# Patient Record
Sex: Female | Born: 1965
Health system: Southern US, Community
[De-identification: ages and names within clinical notes are randomized; demographics above are authoritative.]

## PROBLEM LIST (undated history)

## (undated) DIAGNOSIS — E079 Disorder of thyroid, unspecified: Secondary | ICD-10-CM

## (undated) DIAGNOSIS — J309 Allergic rhinitis, unspecified: Secondary | ICD-10-CM

## (undated) DIAGNOSIS — C50919 Malignant neoplasm of unspecified site of unspecified female breast: Secondary | ICD-10-CM

## (undated) DIAGNOSIS — D179 Benign lipomatous neoplasm, unspecified: Secondary | ICD-10-CM

## (undated) DIAGNOSIS — K589 Irritable bowel syndrome without diarrhea: Secondary | ICD-10-CM

## (undated) HISTORY — DX: Allergic rhinitis, unspecified: J30.9

## (undated) HISTORY — DX: Irritable bowel syndrome, unspecified: K58.9

## (undated) HISTORY — DX: Disorder of thyroid, unspecified: E07.9

## (undated) HISTORY — DX: Malignant neoplasm of unspecified site of unspecified female breast: C50.919

## (undated) HISTORY — DX: Benign lipomatous neoplasm, unspecified: D17.9

## (undated) HISTORY — PX: BREAST RECONSTRUCTION: SHX9

---

## 2003-04-16 HISTORY — PX: BREAST BIOPSY: SHX20

## 2003-05-17 DIAGNOSIS — C50919 Malignant neoplasm of unspecified site of unspecified female breast: Secondary | ICD-10-CM

## 2003-05-17 HISTORY — PX: MASTECTOMY: SHX3

## 2003-05-17 HISTORY — PX: BREAST LUMPECTOMY: SHX2

## 2003-05-17 HISTORY — DX: Malignant neoplasm of unspecified site of unspecified female breast: C50.919

## 2010-04-15 LAB — CONVERTED CEMR LAB

## 2010-05-11 ENCOUNTER — Ambulatory Visit (HOSPITAL_BASED_OUTPATIENT_CLINIC_OR_DEPARTMENT_OTHER): Payer: 59 | Admitting: Oncology

## 2010-05-16 DIAGNOSIS — E079 Disorder of thyroid, unspecified: Secondary | ICD-10-CM

## 2010-05-16 HISTORY — DX: Disorder of thyroid, unspecified: E07.9

## 2010-06-03 ENCOUNTER — Encounter: Payer: Self-pay | Admitting: Internal Medicine

## 2010-06-03 ENCOUNTER — Other Ambulatory Visit: Payer: Self-pay | Admitting: Internal Medicine

## 2010-06-03 ENCOUNTER — Ambulatory Visit
Admission: RE | Admit: 2010-06-03 | Discharge: 2010-06-03 | Payer: Self-pay | Source: Home / Self Care | Attending: Internal Medicine | Admitting: Internal Medicine

## 2010-06-03 DIAGNOSIS — R197 Diarrhea, unspecified: Secondary | ICD-10-CM | POA: Insufficient documentation

## 2010-06-03 DIAGNOSIS — M79609 Pain in unspecified limb: Secondary | ICD-10-CM | POA: Insufficient documentation

## 2010-06-03 DIAGNOSIS — C50919 Malignant neoplasm of unspecified site of unspecified female breast: Secondary | ICD-10-CM | POA: Insufficient documentation

## 2010-06-03 DIAGNOSIS — D179 Benign lipomatous neoplasm, unspecified: Secondary | ICD-10-CM | POA: Insufficient documentation

## 2010-06-03 DIAGNOSIS — J309 Allergic rhinitis, unspecified: Secondary | ICD-10-CM | POA: Insufficient documentation

## 2010-06-03 DIAGNOSIS — E079 Disorder of thyroid, unspecified: Secondary | ICD-10-CM | POA: Insufficient documentation

## 2010-06-03 LAB — BASIC METABOLIC PANEL
BUN: 15 mg/dL (ref 6–23)
CO2: 25 mEq/L (ref 19–32)
Calcium: 9.4 mg/dL (ref 8.4–10.5)
Chloride: 101 mEq/L (ref 96–112)
Creatinine, Ser: 0.9 mg/dL (ref 0.4–1.2)
GFR: 71.32 mL/min (ref >60.00)
Glucose, Bld: 92 mg/dL (ref 70–99)
Potassium: 3.8 mEq/L (ref 3.5–5.1)
Sodium: 133 mEq/L — ABNORMAL LOW (ref 135–145)

## 2010-06-03 LAB — URINALYSIS, ROUTINE W REFLEX MICROSCOPIC
Bilirubin Urine: NEGATIVE
Hemoglobin, Urine: NEGATIVE
Ketones, ur: NEGATIVE
Leukocytes, UA: NEGATIVE
Nitrite: NEGATIVE
Specific Gravity, Urine: 1.02 (ref 1.000–1.030)
Total Protein, Urine: NEGATIVE
Urine Glucose: NEGATIVE
Urobilinogen, UA: 0.2 (ref 0.0–1.0)
pH: 5.5 (ref 5.0–8.0)

## 2010-06-03 LAB — CBC WITH DIFFERENTIAL/PLATELET
Basophils Absolute: 0 10*3/uL (ref 0.0–0.1)
Basophils Relative: 0.7 % (ref 0.0–3.0)
Eosinophils Absolute: 0.3 10*3/uL (ref 0.0–0.7)
Eosinophils Relative: 4.3 % (ref 0.0–5.0)
HCT: 39.7 % (ref 36.0–46.0)
Hemoglobin: 13.6 g/dL (ref 12.0–15.0)
Lymphocytes Relative: 26 % (ref 12.0–46.0)
Lymphs Abs: 1.5 10*3/uL (ref 0.7–4.0)
MCHC: 34.2 g/dL (ref 30.0–36.0)
MCV: 89.6 fl (ref 78.0–100.0)
Monocytes Absolute: 0.5 10*3/uL (ref 0.1–1.0)
Monocytes Relative: 8.1 % (ref 3.0–12.0)
Neutro Abs: 3.6 10*3/uL (ref 1.4–7.7)
Neutrophils Relative %: 60.9 % (ref 43.0–77.0)
Platelets: 263 10*3/uL (ref 150.0–400.0)
RBC: 4.44 Mil/uL (ref 3.87–5.11)
RDW: 13.7 % (ref 11.5–14.6)
WBC: 5.9 10*3/uL (ref 4.5–10.5)

## 2010-06-03 LAB — HEPATIC FUNCTION PANEL
ALT: 24 U/L (ref 0–35)
AST: 29 U/L (ref 0–37)
Albumin: 4.6 g/dL (ref 3.5–5.2)
Alkaline Phosphatase: 42 U/L (ref 39–117)
Bilirubin, Direct: 0.1 mg/dL (ref 0.0–0.3)
Total Bilirubin: 0.9 mg/dL (ref 0.3–1.2)
Total Protein: 8 g/dL (ref 6.0–8.3)

## 2010-06-03 LAB — LDL CHOLESTEROL, DIRECT: Direct LDL: 112.7 mg/dL

## 2010-06-03 LAB — TSH: TSH: 0.07 u[IU]/mL — ABNORMAL LOW (ref 0.35–5.50)

## 2010-06-03 LAB — LIPID PANEL
Cholesterol: 212 mg/dL — ABNORMAL HIGH (ref 0–200)
HDL: 78 mg/dL (ref >39.00)
Total CHOL/HDL Ratio: 3
Triglycerides: 66 mg/dL (ref 0.0–149.0)
VLDL: 13.2 mg/dL (ref 0.0–40.0)

## 2010-06-04 LAB — T3, FREE: T3, Free: 2.3 pg/mL (ref 2.3–4.2)

## 2010-06-04 LAB — T4, FREE: Free T4: 0.9 ng/dL (ref 0.60–1.60)

## 2010-06-07 LAB — CONVERTED CEMR LAB: IgE (Immunoglobulin E), Serum: 320.2 intl units/mL — ABNORMAL HIGH (ref 0.0–180.0)

## 2010-06-17 NOTE — Assessment & Plan Note (Signed)
Summary: NEW/ UMR / NWS  #   Vital Signs:  Patient profile:   45 year old female Height:      71 inches (180.34 cm) Weight:      140.8 pounds (64 kg) BMI:     19.71 O2 Sat:      99 % on Room air Temp:     98.5 degrees F (36.94 degrees C) oral Pulse rate:   79 / minute BP sitting:   118 / 72  (left arm) Cuff size:   regular  Vitals Entered By: Orlan Leavens RMA (June 03, 2010 9:43 AM)  O2 Flow:  Room air CC: New patient Is Patient Diabetic? No Pain Assessment Patient in pain? no        Primary Care Provider:  Newt Lukes MD  CC:  New patient.  History of Present Illness: new pt to me and our practice, here to est care patient is here today for annual physical. Patient feels well.  Also has few concerns - 1) c/o lipoma over right forearm - present for months w/o change in size no pain, no interference with wirst/elbow function would like removed if possible for cosemetic reasons  2) c/o left index finger pain onset 2 mo ago precipitated prolonged by tight grip on cat during bathing session located in prox PIP of index finger pain with full flexion and extension also a/w swelling of this joint compared to all other joints  3) chronically unformed stools but not excess freq no fever, no change in diet, no weight loss, brbpr or abd pain - ?food allergy  4) hx breast ca - dx 04/2003 s/p lumpectomy and bilateral mastectomy 05/2003 elected against tamoxifen or chemo regular checks, planning to est with local onc ([facility])    Preventive Screening-Counseling & Management  Alcohol-Tobacco     Alcohol drinks/day: 0     Alcohol Counseling: not indicated; patient does not drink     Smoking Status: never     Tobacco Counseling: not indicated; no tobacco use  Caffeine-Diet-Exercise     Diet Counseling: not indicated; diet is assessed to be healthy     Does Patient Exercise: yes     Exercise Counseling: not indicated; exercise is  adequate  Safety-Violence-Falls     Seat Belt Counseling: not indicated; patient wears seat belts     Helmet Counseling: not indicated; patient wears helmet when riding bicycle/motocycle     Firearm Counseling: not indicated; uses recommended firearm safety measures     Violence Counseling: not indicated; no violence risk noted  Clinical Review Panels:  Prevention   Last Pap Smear:  Interpretation Result:Negative for intraepithelial Lesion or Malignancy.    (04/15/2010)   Current Medications (verified): 1)  Ldn .... Take 1 Po Once Daily  Allergies (verified): 1)  ! Penicillin 2)  ! * Kaplex  Past History:  Past Medical History: Breast cancer,  dx 04/2003 - lumpectomy + B mastectomy, no chemo Allergic rhinitis  MD roster: gyn - [facility] - ([facility])  Past Surgical History: Breast biopsy (04/2003) Lumpectomy (05/2003) Mastectomy, bilateral (05/2003) breast reconstruction  Family History: Family History of Arthritis (other relative) Family History Diabetes 1st degree relative (grandparent) Family History Hypertension (parent)  mom - chronic bronchitis dad - HTN, ESRD on HD x4y, s unsucc renal txplt, dyslipidemia  Social History: Never Smoked no alcohol married, spouse is Scientist, water quality (traid Academic librarian) - 2 kids moved to [place] fall 2011 from [place] area Smoking Status:  never Does Patient  Exercise:  yes  Review of Systems       see HPI above. I have reviewed all other systems and they were negative.   Physical Exam  General:  thin, fit, alert, well-developed, well-nourished, and cooperative to examination.    Head:  Normocephalic and atraumatic without obvious abnormalities. No apparent alopecia or balding. Eyes:  vision grossly intact; pupils equal, round and reactive to light.  conjunctiva and lids normal.    Ears:  R ear normal and L ear normal.   Mouth:  teeth and gums in good repair; mucous membranes moist, without lesions or ulcers. oropharynx clear  without exudate, no erythema.  Neck:  supple, full ROM, no masses, no thyromegaly; no thyroid nodules or tenderness. no JVD or carotid bruits.   Lungs:  normal respiratory effort, no intercostal retractions or use of accessory muscles; normal breath sounds bilaterally - no crackles and no wheezes.    Heart:  normal rate, regular rhythm, no murmur, and no rub. BLE without edema. Abdomen:  soft, non-tender, normal bowel sounds, no distention; no masses and no appreciable hepatomegaly or splenomegaly.   Genitalia:  defer to gyn Msk:  mild bony enlargement of left index PIP without synovitis - FROM intact with pain during extention and full flexion Neurologic:  alert & oriented X3 and cranial nerves II-XII symetrically intact.  strength normal in all extremities, sensation intact to light touch, and gait normal. speech fluent without dysarthria or aphasia; follows commands with good comprehension.  Skin:  soft mobile 2cm lipoma over prox r forearm on flexor surface Psych:  Oriented X3, memory intact for recent and remote, normally interactive, good eye contact, not anxious appearing, not depressed appearing, and not agitated.      Impression & Recommendations:  Problem # 1:  PREVENTIVE HEALTH CARE (ICD-V70.0) Patient has been counseled on age-appropriate routine health concerns for screening and prevention. These are reviewed and up-to-date. Immunizations are up-to-date or declined. Labs aordered/ reviewed.  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 reassurance offered - pt interest in opinon on removal for cosmetic reason will defer further eval to hand for this and finger pain issues  Problem # 4:  ADENOCARCINOMA, 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   Complete Medication List: 1)  Ldn  .... Take 1 po once daily  Patient Instructions: 1)  it was good to see you today. 2)  test(s) ordered today - your results will be posted on the phone tree for review in 48-72 hours from the time of test completion; call [phone] and enter your 9 digit MRN (listed above on this page, just below your name); if any changes  need to be made or there are abnormal results, you will be contacted directly.  3)  we'll make referral to dr. Amanda Pea, hand specialist. Our office will contact you regarding this appointment once made.  4)  Please schedule a follow-up appointment annually for medical physcial and labs, call sooner if problems.    Orders Added: 1)  TLB-Lipid Panel [80061-LIPID] 2)  TLB-BMP (Basic Metabolic Panel-BMET) [80048-METABOL] 3)  TLB-CBC Platelet - w/Differential [85025-CBCD] 4)  TLB-Hepatic/Liver Function Pnl [80076-HEPATIC] 5)  TLB-TSH (Thyroid Stimulating Hormone) [84443-TSH] 6)  TLB-Udip w/ Micro [81001-URINE] 7)  T-Food Allergy Profile Specific IgE [86003/[id number]] 8)  T-Finger(s) [73140TC] 9)  Orthopedic Referral [Ortho] 10)  New Patient 40-64 years [99386] 12)  New Patient Level II  [99202]     Pap Smear  Procedure date:  04/15/2010  Findings:      Interpretation Result:Negative for intraepithelial Lesion or Malignancy.

## 2010-06-28 ENCOUNTER — Encounter (HOSPITAL_BASED_OUTPATIENT_CLINIC_OR_DEPARTMENT_OTHER): Payer: 59 | Admitting: Oncology

## 2010-06-28 ENCOUNTER — Encounter: Payer: Self-pay | Admitting: Internal Medicine

## 2010-06-28 DIAGNOSIS — C50919 Malignant neoplasm of unspecified site of unspecified female breast: Secondary | ICD-10-CM

## 2010-06-28 LAB — COMPREHENSIVE METABOLIC PANEL
ALT: 17 U/L (ref 0–35)
AST: 22 U/L (ref 0–37)
Albumin: 4.8 g/dL (ref 3.5–5.2)
Alkaline Phosphatase: 39 U/L (ref 39–117)
BUN: 16 mg/dL (ref 6–23)
CO2: 24 mEq/L (ref 19–32)
Calcium: 9.2 mg/dL (ref 8.4–10.5)
Chloride: 103 mEq/L (ref 96–112)
Creatinine, Ser: 0.94 mg/dL (ref 0.40–1.20)
Glucose, Bld: 95 mg/dL (ref 70–99)
Potassium: 3.7 mEq/L (ref 3.5–5.3)
Sodium: 137 mEq/L (ref 135–145)
Total Bilirubin: 0.7 mg/dL (ref 0.3–1.2)
Total Protein: 7.2 g/dL (ref 6.0–8.3)

## 2010-06-28 LAB — CBC WITH DIFFERENTIAL/PLATELET
BASO%: 0.5 % (ref 0.0–2.0)
Basophils Absolute: 0 10*3/uL (ref 0.0–0.1)
EOS%: 5.4 % (ref 0.0–7.0)
Eosinophils Absolute: 0.3 10*3/uL (ref 0.0–0.5)
HCT: 39.3 % (ref 34.8–46.6)
HGB: 13.2 g/dL (ref 11.6–15.9)
LYMPH%: 25.8 % (ref 14.0–49.7)
MCH: 29.9 pg (ref 25.1–34.0)
MCHC: 33.5 g/dL (ref 31.5–36.0)
MCV: 89.4 fL (ref 79.5–101.0)
MONO#: 0.5 10*3/uL (ref 0.1–0.9)
MONO%: 9.8 % (ref 0.0–14.0)
NEUT#: 2.8 10*3/uL (ref 1.5–6.5)
NEUT%: 58.5 % (ref 38.4–76.8)
Platelets: 236 10*3/uL (ref 145–400)
RBC: 4.4 10*6/uL (ref 3.70–5.45)
RDW: 14 % (ref 11.2–14.5)
WBC: 4.7 10*3/uL (ref 3.9–10.3)
lymph#: 1.2 10*3/uL (ref 0.9–3.3)

## 2010-07-22 NOTE — Consult Note (Signed)
Summary: [facility]  [facility]   Imported By: Lester Agar 07/14/2010 08:26:15  _____________________________________________________________________  External Attachment:    Type:   Image     Comment:   External Document

## 2010-11-11 ENCOUNTER — Encounter: Payer: Self-pay | Admitting: Internal Medicine

## 2010-11-11 ENCOUNTER — Other Ambulatory Visit: Payer: 59

## 2010-11-11 ENCOUNTER — Ambulatory Visit (INDEPENDENT_AMBULATORY_CARE_PROVIDER_SITE_OTHER): Payer: 59 | Admitting: Internal Medicine

## 2010-11-11 VITALS — BP 112/72 | HR 68 | Temp 97.7°F | Ht 71.0 in | Wt 142.4 lb

## 2010-11-11 DIAGNOSIS — R197 Diarrhea, unspecified: Secondary | ICD-10-CM

## 2010-11-11 DIAGNOSIS — E079 Disorder of thyroid, unspecified: Secondary | ICD-10-CM

## 2010-11-11 NOTE — Progress Notes (Signed)
  Subjective:    Patient ID: Barbara Ingram, female    DOB: 1965-09-25, 45 y.o.   MRN: [id number]  HPI complains of continued diarrhea Would like to be tested for celiac dz and parasites Describes chronic bloating and liquid stools, but not increase freq (1-2 bm/d) Ongoing >6 mo, worse with raw vegetables  Ongoing self directed natural care - acupuncturist, probiotcs, tonics  Past Medical History  Diagnosis Date  . Diarrhea   . LIPOMAS, MULTIPLE   . THYROID STIMULATING HORMONE, ABNORMAL 06/03/2010  . ADENOCARCINOMA, BREAST dx 04/2003    lumpectomy, B mastectomy, no chemo  . ALLERGIC RHINITIS      Review of Systems  Constitutional: Negative for fever.  Respiratory: Negative for cough.   Gastrointestinal: Negative for blood in stool and rectal pain.  Neurological: Negative for headaches.       Objective:   Physical Exam Wt Readings from Last 3 Encounters:  11/11/10 142 lb 6.4 oz (64.592 kg)  06/03/10 140 lb 12.8 oz (63.866 kg)  BP 112/72  Pulse 68  Temp(Src) 97.7 F (36.5 C) (Oral)  Ht 5\' 11"  (1.803 m)  Wt 142 lb 6.4 oz (64.592 kg)  BMI 19.86 kg/m2  SpO2 99% Physical Exam  Constitutional: She is oriented to person, place, and time. She appears well-developed and well-nourished. No distress.  Neck: Normal range of motion. Neck supple. No JVD present. No thyromegaly present.  Cardiovascular: Normal rate, regular rhythm and normal heart sounds.  No murmur heard. No BLE edema. Pulmonary/Chest: Effort normal and breath sounds normal. No respiratory distress. She has no wheezes.  Abdominal: Soft. Bowel sounds are normal. She exhibits no distension. There is no tenderness.  Psychiatric: She has a normal mood and affect. Her behavior is normal. Judgment and thought content normal.   Lab Results  Component Value Date   WBC 5.9 06/03/2010   HGB 13.2 06/28/2010   HCT 39.3 06/28/2010   PLT 236 06/28/2010   CHOL 212* 06/03/2010   TRIG 66.0 06/03/2010   HDL 78.00 06/03/2010   LDLDIRECT  112.7 06/03/2010   ALT 17 06/28/2010   AST 22 06/28/2010   NA 137 06/28/2010   K 3.7 06/28/2010   CL 103 06/28/2010   CREATININE 0.94 06/28/2010   BUN 16 06/28/2010   CO2 24 06/28/2010   TSH 0.07* 06/03/2010       Assessment & Plan:  See problem list. Medications and labs reviewed today.

## 2010-11-11 NOTE — Assessment & Plan Note (Signed)
Food allergy panel unremarkable 06/2010 Check now for O&P and celiac dz -  Ok to continue other holistic measures - probiotic, acupuncture

## 2010-11-11 NOTE — Assessment & Plan Note (Signed)
Lab Results  Component Value Date   TSH 0.07* 06/03/2010   Detected at [facility] 05/2010 but normal T3/FT4 - May be related to GI symptoms but will exclude other dz as requested by pt 1st

## 2010-11-11 NOTE — Patient Instructions (Signed)
It was good to see you today. Test(s) ordered today as requested. Your results will be called to you after review (48-72hours after test completion). If any changes need to be made, you will be notified at that time. If needed, we can consider GI evaluation for this issue - let me know!

## 2010-11-12 LAB — GLIA (IGA/G) + TTG IGA
Gliadin IgA: 4.4 U/mL (ref <20)
Gliadin IgG: 2.5 U/mL (ref <20)
Tissue Transglutaminase Ab, IgA: 2.6 U/mL (ref <20)

## 2010-11-12 LAB — OVA AND PARASITE SCREEN: OP: NONE SEEN

## 2010-11-15 ENCOUNTER — Other Ambulatory Visit: Payer: Self-pay | Admitting: Internal Medicine

## 2010-11-15 DIAGNOSIS — R195 Other fecal abnormalities: Secondary | ICD-10-CM | POA: Insufficient documentation

## 2010-11-15 NOTE — Telephone Encounter (Signed)
Call to pt re: diarrhea test results - no answer > LMOM asking for return call:  No evidence for celiac disease - also no "worms or parasites" on stool test but stool did sow "abuntant yeast" -  i am not sure yeast is causing her diarrhea but we will treat for yeast x 14 days to see if this helps her diarrhea - erx diflucan done. If unimproved diarrhea symptoms after 14 days diflucan, please ROV to discuss other options and repeat thyroid testing for eval of diarrhea -symptoms - thanks

## 2010-11-16 MED ORDER — FLUCONAZOLE 100 MG PO TABS: 100.0000 mg | ORAL_TABLET | Freq: Every day | ORAL | Status: AC

## 2010-11-16 NOTE — Telephone Encounter (Signed)
Pt return call back gave results concerning stool test...11/16/10@11 :40am/LMB

## 2011-01-04 ENCOUNTER — Encounter (HOSPITAL_BASED_OUTPATIENT_CLINIC_OR_DEPARTMENT_OTHER): Payer: 59 | Admitting: Oncology

## 2011-01-04 ENCOUNTER — Other Ambulatory Visit: Payer: Self-pay | Admitting: Oncology

## 2011-01-04 DIAGNOSIS — C50919 Malignant neoplasm of unspecified site of unspecified female breast: Secondary | ICD-10-CM

## 2011-01-04 LAB — CBC WITH DIFFERENTIAL/PLATELET
BASO%: 0.6 % (ref 0.0–2.0)
Basophils Absolute: 0 10*3/uL (ref 0.0–0.1)
EOS%: 2.6 % (ref 0.0–7.0)
Eosinophils Absolute: 0.1 10*3/uL (ref 0.0–0.5)
HCT: 40.6 % (ref 34.8–46.6)
HGB: 13.8 g/dL (ref 11.6–15.9)
LYMPH%: 24.5 % (ref 14.0–49.7)
MCH: 30.3 pg (ref 25.1–34.0)
MCHC: 33.9 g/dL (ref 31.5–36.0)
MCV: 89.3 fL (ref 79.5–101.0)
MONO#: 0.4 10*3/uL (ref 0.1–0.9)
MONO%: 7.5 % (ref 0.0–14.0)
NEUT#: 3.1 10*3/uL (ref 1.5–6.5)
NEUT%: 64.8 % (ref 38.4–76.8)
Platelets: 246 10*3/uL (ref 145–400)
RBC: 4.54 10*6/uL (ref 3.70–5.45)
RDW: 13.4 % (ref 11.2–14.5)
WBC: 4.7 10*3/uL (ref 3.9–10.3)
lymph#: 1.2 10*3/uL (ref 0.9–3.3)

## 2011-01-04 LAB — COMPREHENSIVE METABOLIC PANEL
ALT: 14 U/L (ref 0–35)
AST: 21 U/L (ref 0–37)
Albumin: 4.8 g/dL (ref 3.5–5.2)
Alkaline Phosphatase: 36 U/L — ABNORMAL LOW (ref 39–117)
BUN: 14 mg/dL (ref 6–23)
CO2: 21 mEq/L (ref 19–32)
Calcium: 9.7 mg/dL (ref 8.4–10.5)
Chloride: 103 mEq/L (ref 96–112)
Creatinine, Ser: 0.97 mg/dL (ref 0.50–1.10)
Glucose, Bld: 124 mg/dL — ABNORMAL HIGH (ref 70–99)
Potassium: 4 mEq/L (ref 3.5–5.3)
Sodium: 136 mEq/L (ref 135–145)
Total Bilirubin: 0.4 mg/dL (ref 0.3–1.2)
Total Protein: 7.7 g/dL (ref 6.0–8.3)

## 2011-01-04 LAB — VITAMIN D 25 HYDROXY (VIT D DEFICIENCY, FRACTURES): Vit D, 25-Hydroxy: 78 ng/mL (ref 30–89)

## 2011-01-27 IMAGING — CR DG FINGER INDEX 2+V*L*
1 series · 1 of 1 positions shown · non-contrast
Comparison: None.

CLINICAL DATA: Swelling and pain for 2 months at the proximal PIP
joint.  .

LEFT INDEX FINGER 2+V

[view not recorded]
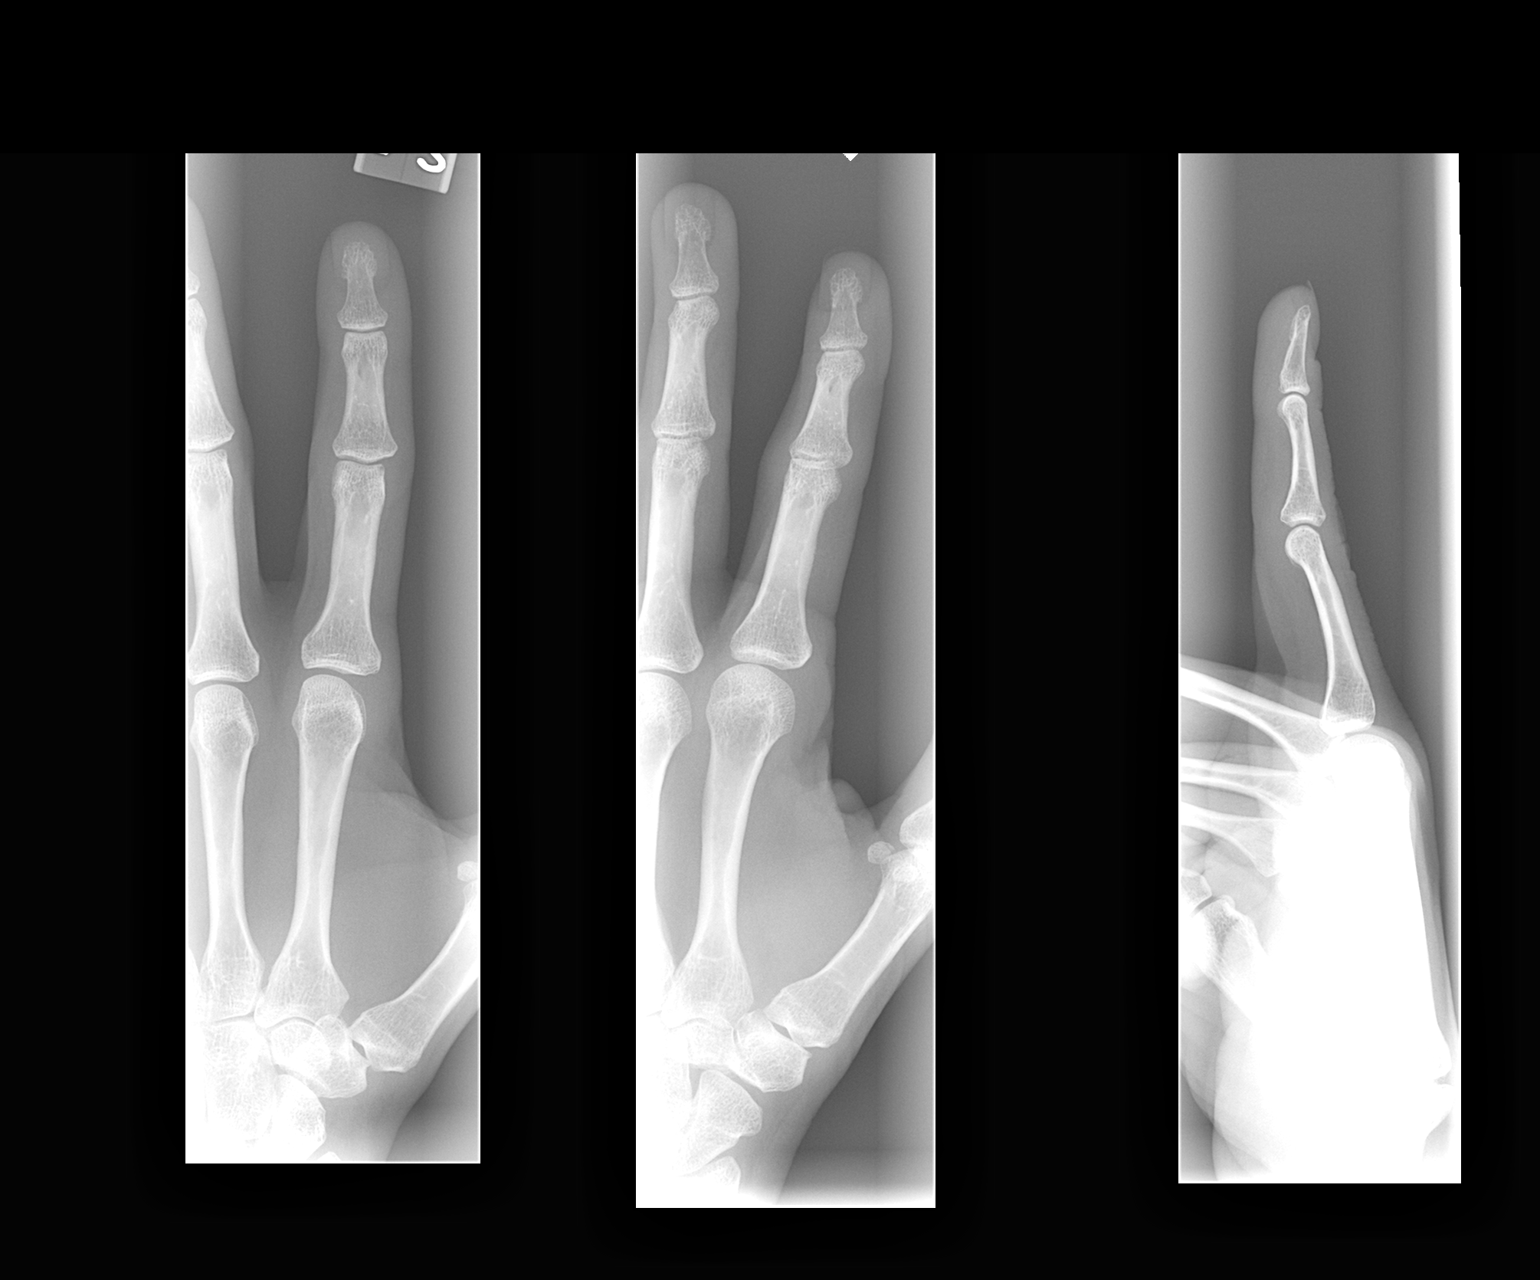

[1 of 1 positions shown; findings below may reference images not displayed]

FINDINGS: No acute fracture or dislocation.  No definite soft
tissue swelling.
IMPRESSION: No acute findings about the left index finger.

## 2011-02-22 ENCOUNTER — Telehealth: Payer: Self-pay

## 2011-02-22 DIAGNOSIS — R197 Diarrhea, unspecified: Secondary | ICD-10-CM

## 2011-02-22 DIAGNOSIS — R195 Other fecal abnormalities: Secondary | ICD-10-CM

## 2011-02-22 NOTE — Telephone Encounter (Signed)
I am not sure that yeast is the cause for her diarrhea symptoms (despite finding yeast in the 10/2010 sample) - i would rather pt be eval by GI specialist to if rx antifungal is approp tx - referral done

## 2011-02-22 NOTE — Telephone Encounter (Signed)
Pt called stating that she has tried a strict diet and antifungal medication with no improvement of sxs. Pt is requesting alternative antifungal as well as lab result - how much yeast found in stool?

## 2011-02-23 ENCOUNTER — Encounter: Payer: Self-pay | Admitting: Gastroenterology

## 2011-02-23 NOTE — Telephone Encounter (Signed)
Pt advised of MD's recommendation and has received appt time and day for GI consult

## 2011-02-23 NOTE — Telephone Encounter (Signed)
Left message on machine for pt to return my call  

## 2011-03-01 ENCOUNTER — Ambulatory Visit (INDEPENDENT_AMBULATORY_CARE_PROVIDER_SITE_OTHER): Payer: 59 | Admitting: Gastroenterology

## 2011-03-01 ENCOUNTER — Encounter: Payer: Self-pay | Admitting: Gastroenterology

## 2011-03-01 VITALS — BP 110/70 | HR 80 | Ht 70.5 in | Wt 136.0 lb

## 2011-03-01 DIAGNOSIS — K589 Irritable bowel syndrome without diarrhea: Secondary | ICD-10-CM | POA: Insufficient documentation

## 2011-03-01 DIAGNOSIS — R197 Diarrhea, unspecified: Secondary | ICD-10-CM

## 2011-03-01 MED ORDER — CILIDINIUM-CHLORDIAZEPOXIDE 2.5-5 MG PO CAPS: 1.0000 | ORAL_CAPSULE | Freq: Three times a day (TID) | ORAL | Status: DC | PRN

## 2011-03-01 MED ORDER — RIFAXIMIN 550 MG PO TABS: 550.0000 mg | ORAL_TABLET | Freq: Two times a day (BID) | ORAL | Status: DC

## 2011-03-01 MED ORDER — ALIGN PO CAPS: 1.0000 | ORAL_CAPSULE | Freq: Every day | ORAL | Status: DC

## 2011-03-01 NOTE — Patient Instructions (Addendum)
We have sent the following medications to your pharmacy for you to pick up at your convenience: Xifaxan 550 mg. Take 1 capsule by mouth twice daily x 2 weeks/ Librax. Take as needed  We have given you samples of Align. This puts good bacteria back into your intestines. You should take 1 capsule by mouth once daily. If this works well for you, it can be purchased over the counter. Your physician has requested that you go to the basement for the following lab work before leaving today: Stool for C Diff Please follow up with Dr Jarold Motto in 2 weeks. We have given you a brochure on C Diff

## 2011-03-01 NOTE — Progress Notes (Signed)
History of Present Illness:  This is a pleasant 45 year old Caucasian female status post right mastectomy with multiple reconstructive surgical procedures the last 2 years. She has had loose nonbloody bowel movements over the last 2 years without abdominal pain, distention, or excessive flatus. Trial of various probiotics have not been useful. Stool for ova and parasite was negative and she had a negative celiac antibody panel. There was Candida in her stool specimen and she apparently was treated with antifungal agents without improvement. The patient is on low-dose naltrexone given to her by a physician in [facility] apparently for immune stimulation. The patient has had a large amount of stress associated with the illness of herself and her father. She denies any specific food intolerances but does avoid high-fiber foods and lactose. On a bad day, she may have 3 loose stools, but denies urgency, still stooling, or nocturnal diarrhea. Has been no anorexia, weight loss, fever, chills, mouth sores, skin rashes, or arthritis. Family history is remarkable for an aunt with Crohn's disease. Review of the patient labs shows no specific abnormalities. Denies abuse of alcohol, cigarettes, or NSAIDs. There are no sick family members at home, she denies recent foreign travel.  I have reviewed this patient's present history, medical and surgical past history, allergies and medications.     ROS: The remainder of the 10 point ROS is negative... no general malaise, fatigue, or other systemic complaints. Has 6 surgeries in the last 3 years. Gynecologic examination was apparently normal. She does have a history of penicillin allergy.     Physical Exam: General well developed well nourished patient in no acute distress, appearing her stated age Eyes PERRLA, no icterus, fundoscopic exam per opthamologist Skin no lesions noted Neck supple, no adenopathy, no thyroid enlargement, no tenderness Chest clear to  percussion and auscultation Heart no significant murmurs, gallops or rubs noted Abdomen no hepatosplenomegaly masses or tenderness, BS normal. . Extremities no acute joint lesions, edema, phlebitis or evidence of cellulitis. Neurologic patient oriented x 3, cranial nerves intact, no focal neurologic deficits noted. Psychological mental status normal and normal affect.  Assessment and plan: Probable IBS, diarrhea predominant. I've ordered stool for C. difficile toxin, and it decided to try treatment for bacterial overgrowth syndrome with Xifaxan 550 mg twice a day for 2 weeks with Align the body therapy. I also prescribed when necessary Librax every 6-8 hours as needed. Patient may need colonoscopy exam the patient in our clinical response. Her symptoms do not sound like inflammatory bowel disease or malabsorption. I do think she has an element of lactose intolerance. She specifically denies ingestion of other nonabsorbable carbohydrate such as sorbitol or fructose. We'll see her back in 2-3 weeks' time for followup.  Encounter Diagnosis  Name Primary?  . Diarrhea Yes

## 2011-03-02 ENCOUNTER — Telehealth: Payer: Self-pay | Admitting: Gastroenterology

## 2011-03-02 DIAGNOSIS — R350 Frequency of micturition: Secondary | ICD-10-CM

## 2011-03-03 NOTE — Telephone Encounter (Signed)
Notified pt Dr Jarold Motto ordered UA and C&S. Pt will also do CDIFF.

## 2011-03-03 NOTE — Telephone Encounter (Signed)
Do UA and culture,.Marland KitchenMarland Kitchen

## 2011-03-03 NOTE — Telephone Encounter (Signed)
Pt saw Dr Jarold Motto on 03/01/11 for diarrhea, probable IBS put on Xifaxin d/t her many surgeries and antibiotics; she will f/u on 03/22/11. Pt reports this am she feels as though she has a UTI and maybe that's her problem. She has bllod in her urine and frequency- no burning. She called Dr Diamantina Monks of[facility] to see if she could bring in a sample and they told her she would need an OV. Pt doesn't want to start Xifaxin if she has a UTI and needs another antibiotic. Can we order a UA on pt? Thanks.

## 2011-03-04 ENCOUNTER — Telehealth: Payer: Self-pay | Admitting: *Deleted

## 2011-03-04 ENCOUNTER — Other Ambulatory Visit (INDEPENDENT_AMBULATORY_CARE_PROVIDER_SITE_OTHER): Payer: 59

## 2011-03-04 DIAGNOSIS — R197 Diarrhea, unspecified: Secondary | ICD-10-CM

## 2011-03-04 DIAGNOSIS — R319 Hematuria, unspecified: Secondary | ICD-10-CM

## 2011-03-04 DIAGNOSIS — R350 Frequency of micturition: Secondary | ICD-10-CM

## 2011-03-04 LAB — URINALYSIS
Bilirubin Urine: NEGATIVE
Hgb urine dipstick: NEGATIVE
Ketones, ur: NEGATIVE
Leukocytes, UA: NEGATIVE
Nitrite: NEGATIVE
Specific Gravity, Urine: >=1.03 (ref 1.000–1.030)
Total Protein, Urine: NEGATIVE
Urine Glucose: NEGATIVE
Urobilinogen, UA: 0.2 (ref 0.0–1.0)
pH: 5.5 (ref 5.0–8.0)

## 2011-03-04 NOTE — Telephone Encounter (Signed)
lmom for pt to call back. Lm on her identified VM that she does not have a UTI, please start the Xifaxin.

## 2011-03-04 NOTE — Progress Notes (Signed)
Left message on voicemail that patient does not have a UTI and that she should begin Xifaxan therapy as previously discussed. She it to call back with any questions.

## 2011-03-04 NOTE — Telephone Encounter (Signed)
Message copied by Florene Glen on Fri Mar 04, 2011  1:23 PM ------      Message from: [place], [place] R      Created: Fri Mar 04, 2011 12:37 PM       Not a UTI, please start Xifaxan therapy as requested

## 2011-03-06 LAB — URINE CULTURE
Colony Count: NO GROWTH
Organism ID, Bacteria: NO GROWTH

## 2011-03-07 LAB — CLOSTRIDIUM DIFFICILE BY PCR: Toxigenic C. Difficile by PCR: NOT DETECTED

## 2011-03-21 ENCOUNTER — Ambulatory Visit (INDEPENDENT_AMBULATORY_CARE_PROVIDER_SITE_OTHER): Payer: 59 | Admitting: Internal Medicine

## 2011-03-21 ENCOUNTER — Encounter: Payer: Self-pay | Admitting: Gastroenterology

## 2011-03-21 ENCOUNTER — Encounter: Payer: Self-pay | Admitting: Internal Medicine

## 2011-03-21 VITALS — BP 118/78 | HR 76 | Temp 98.1°F | Wt 130.0 lb

## 2011-03-21 DIAGNOSIS — F3289 Other specified depressive episodes: Secondary | ICD-10-CM

## 2011-03-21 DIAGNOSIS — F329 Major depressive disorder, single episode, unspecified: Secondary | ICD-10-CM

## 2011-03-21 DIAGNOSIS — F32A Depression, unspecified: Secondary | ICD-10-CM | POA: Insufficient documentation

## 2011-03-21 MED ORDER — CITALOPRAM HYDROBROMIDE 10 MG PO TABS: 10.0000 mg | ORAL_TABLET | Freq: Every day | ORAL | Status: DC

## 2011-03-21 MED ORDER — ALPRAZOLAM 0.25 MG PO TABS: 0.2500 mg | ORAL_TABLET | Freq: Three times a day (TID) | ORAL | Status: DC | PRN

## 2011-03-21 NOTE — Assessment & Plan Note (Signed)
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

## 2011-03-21 NOTE — Patient Instructions (Signed)
It was good to see you today. Start citalopram daily and alprazolam as needed for depression and anxiety symptoms Your prescription(s) have been submitted to your pharmacy. Please take as directed and contact our office if you believe you are having problem(s) with the medication(s). Continue counseling as ongoing and followup here in 6 weeks for medication and symptoms review;  call sooner if problems

## 2011-03-21 NOTE — Progress Notes (Signed)
  Subjective:    Patient ID: Barbara Ingram, female    DOB: 1965/12/14, 45 y.o.   MRN: [id number]  HPI Complaints of depression symptom Onset greater than one month ago Situationally induced, but symptoms gradually worsening over time No prior history of same Manifest with easy tearfulness, loss of interest in previously pleasurable things, irritability and poor sleep Also flares of panic symptoms which are improved with use of prn low-dose Xanax borrowed from a friend Has already begun personal counseling, but requests medication to help with control of symptoms Denies suicide ideation  Past Medical History  Diagnosis Date  . LIPOMAS, MULTIPLE   . THYROID STIMULATING HORMONE, ABNORMAL 2012  . ADENOCARCINOMA, BREAST dx 04/2003    lumpectomy, B mastectomy, no chemo  . ALLERGIC RHINITIS   . IBS (irritable bowel syndrome)      Review of Systems  Constitutional: Negative for fever and unexpected weight change.  Psychiatric/Behavioral: Positive for dysphoric mood. Negative for hallucinations, behavioral problems and self-injury. The patient is nervous/anxious. The patient is not hyperactive.        Objective:   Physical Exam BP 118/78  Pulse 76  Temp(Src) 98.1 F (36.7 C) (Oral)  Wt 130 lb (58.968 kg)  SpO2 98% Gen: Thin, well-nourished but emotional Lungs: CTA CV: RRR, no edema Psyc: Tearful during exam, judgment and insight appropriate, depressed and anxious mood/affect  Lab Results  Component Value Date   WBC 5.9 06/03/2010   HGB 13.8 01/04/2011   HCT 40.6 01/04/2011   PLT 246 01/04/2011   GLUCOSE 124* 01/04/2011   GLUCOSE 124* 01/04/2011   CHOL 212* 06/03/2010   TRIG 66.0 06/03/2010   HDL 78.00 06/03/2010   LDLDIRECT 112.7 06/03/2010   ALT 14 01/04/2011   ALT 14 01/04/2011   AST 21 01/04/2011   AST 21 01/04/2011   NA 136 01/04/2011   NA 136 01/04/2011   K 4.0 01/04/2011   K 4.0 01/04/2011   CL 103 01/04/2011   CL 103 01/04/2011   CREATININE 0.97 01/04/2011   CREATININE 0.97  01/04/2011   BUN 14 01/04/2011   BUN 14 01/04/2011   CO2 21 01/04/2011   CO2 21 01/04/2011   TSH 0.07* 06/03/2010       Assessment & Plan:  See problem list. Medications and labs reviewed today.

## 2011-03-25 ENCOUNTER — Encounter: Payer: Self-pay | Admitting: Gastroenterology

## 2011-03-25 ENCOUNTER — Ambulatory Visit (INDEPENDENT_AMBULATORY_CARE_PROVIDER_SITE_OTHER): Payer: 59 | Admitting: Gastroenterology

## 2011-03-25 VITALS — BP 106/72 | HR 72 | Ht 71.0 in | Wt 133.6 lb

## 2011-03-25 DIAGNOSIS — R197 Diarrhea, unspecified: Secondary | ICD-10-CM

## 2011-03-25 DIAGNOSIS — K589 Irritable bowel syndrome without diarrhea: Secondary | ICD-10-CM

## 2011-03-25 MED ORDER — COLESEVELAM HCL 625 MG PO TABS: 1875.0000 mg | ORAL_TABLET | Freq: Every day | ORAL | Status: DC

## 2011-03-25 NOTE — Progress Notes (Signed)
History of Present Illness: This is a 45 year old Caucasian female with several years of loose bowel movements consisting of salt stools without melena or hematochezia, anorexia, weight loss, or other systemic complaints. GI workup has been unremarkable to date. Can. Treatment with Xifaxan 550 mg twice a day and probiotics has not helped her symptomatology. She denies abdominal pain, upper GI or hepatobiliary complaints. She does take Celexa 10 mg a day and Xanax 0.25 mg 3 times a day as needed.  Current Medications, Allergies, Past Medical History, Past Surgical History, Family History and Social History were reviewed in [employer] record.   Assessment and plan: Probable IBS, diarrhea predominant. We have discontinued nonabsorbable carbohydrates from her diet, and she continues with" diarrhea". 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 I have recommended to her that we perform colonoscopy for colon cancer screening her history of breast cancer . Also at that time we will obtain random colon biopsies to exclude microscopic or collagenous colitis. Here her lab shows normal laboratory data and negative celiac serology panel. Also when necessary Imodium AD suggested. Encounter Diagnoses  Name Primary?  . IBS (irritable bowel syndrome) Yes  . Diarrhea

## 2011-03-25 NOTE — Patient Instructions (Signed)
We have sent the following medications to your pharmacy for you to pick up at your convenience: Welchol, take daily  Call back after the first of the year to schedule your colonoscopy.  CC: Rene Paci M.D.

## 2011-05-20 ENCOUNTER — Ambulatory Visit (INDEPENDENT_AMBULATORY_CARE_PROVIDER_SITE_OTHER): Payer: 59 | Admitting: Internal Medicine

## 2011-05-20 ENCOUNTER — Encounter: Payer: Self-pay | Admitting: Internal Medicine

## 2011-05-20 VITALS — BP 120/74 | HR 72 | Temp 98.5°F | Wt 129.8 lb

## 2011-05-20 DIAGNOSIS — F329 Major depressive disorder, single episode, unspecified: Secondary | ICD-10-CM

## 2011-05-20 DIAGNOSIS — F3289 Other specified depressive episodes: Secondary | ICD-10-CM

## 2011-05-20 DIAGNOSIS — F32A Depression, unspecified: Secondary | ICD-10-CM

## 2011-05-20 MED ORDER — CITALOPRAM HYDROBROMIDE 20 MG PO TABS: 20.0000 mg | ORAL_TABLET | Freq: Every day | ORAL | Status: DC

## 2011-05-20 NOTE — Patient Instructions (Signed)
It was good to see you today. Increase dose citalopram to 20mg  daily and continue alprazolam as needed for depression and anxiety symptoms Your prescription(s) have been submitted to your pharmacy. Please take as directed and contact our office if you believe you are having problem(s) with the medication(s). Try [facility] or Vernell Leep (rapid eye movement provider) for other therapy options - or call if other names are desired Continue counseling efforts and followup here in 6 weeks for medication and symptoms review;  call sooner if problems

## 2011-05-20 NOTE — Assessment & Plan Note (Signed)
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

## 2011-05-20 NOTE — Progress Notes (Signed)
  Subjective:    Patient ID: Barbara Ingram, female    DOB: Oct 06, 1965, 46 y.o.   MRN: [id number]  HPI  follow up on depression symptoms Onset fall 2012 Situationally induced, gradually worsening over time No prior history of same Manifest with easy tearfulness, loss of interest in previously pleasurable things, irritability and poor sleep Also flares of panic symptoms which are improved with use of prn low-dose Xanax Has already begun personal counseling, but "not working out" - request alt providers? started low dose SSRI medication 03/2011 to help with control of symptoms > slightly improved Denies suicide ideation  Past Medical History  Diagnosis Date  . LIPOMAS, MULTIPLE   . THYROID STIMULATING HORMONE, ABNORMAL 2012  . ADENOCARCINOMA, BREAST dx 04/2003    lumpectomy, B mastectomy, no chemo  . ALLERGIC RHINITIS   . IBS (irritable bowel syndrome)   . Depression fall 2012     Review of Systems  Constitutional: Negative for fever and unexpected weight change.  Psychiatric/Behavioral: Positive for dysphoric mood. Negative for hallucinations, behavioral problems and self-injury. The patient is nervous/anxious. The patient is not hyperactive.        Objective:   Physical Exam  BP 120/74  Pulse 72  Temp(Src) 98.5 F (36.9 C) (Oral)  Wt 129 lb 12.8 oz (58.877 kg)  SpO2 98% Gen: Thin, well-nourished but emotional Lungs: CTA CV: RRR, no edema Psyc: Tearful during exam, judgment and insight appropriate, depressed and anxious mood/affect  Lab Results  Component Value Date   WBC 4.7 01/04/2011   HGB 13.8 01/04/2011   HCT 40.6 01/04/2011   PLT 246 01/04/2011   GLUCOSE 124* 01/04/2011   GLUCOSE 124* 01/04/2011   CHOL 212* 06/03/2010   TRIG 66.0 06/03/2010   HDL 78.00 06/03/2010   LDLDIRECT 112.7 06/03/2010   ALT 14 01/04/2011   ALT 14 01/04/2011   AST 21 01/04/2011   AST 21 01/04/2011   NA 136 01/04/2011   NA 136 01/04/2011   K 4.0 01/04/2011   K 4.0 01/04/2011   CL 103 01/04/2011     CL 103 01/04/2011   CREATININE 0.97 01/04/2011   CREATININE 0.97 01/04/2011   BUN 14 01/04/2011   BUN 14 01/04/2011   CO2 21 01/04/2011   CO2 21 01/04/2011   TSH 0.07* 06/03/2010       Assessment & Plan:  See problem list. Medications and labs reviewed today.

## 2011-07-15 ENCOUNTER — Ambulatory Visit: Payer: 59 | Admitting: Internal Medicine

## 2011-07-29 ENCOUNTER — Ambulatory Visit: Payer: 59 | Admitting: Internal Medicine

## 2011-08-19 ENCOUNTER — Ambulatory Visit: Payer: 59 | Admitting: Internal Medicine

## 2011-08-31 ENCOUNTER — Encounter: Payer: Self-pay | Admitting: Internal Medicine

## 2011-08-31 ENCOUNTER — Ambulatory Visit (INDEPENDENT_AMBULATORY_CARE_PROVIDER_SITE_OTHER): Payer: 59 | Admitting: Internal Medicine

## 2011-08-31 VITALS — BP 108/68 | HR 94 | Temp 98.5°F | Resp 16 | Ht 71.0 in | Wt 129.0 lb

## 2011-08-31 DIAGNOSIS — F3289 Other specified depressive episodes: Secondary | ICD-10-CM

## 2011-08-31 DIAGNOSIS — F329 Major depressive disorder, single episode, unspecified: Secondary | ICD-10-CM

## 2011-08-31 DIAGNOSIS — F32A Depression, unspecified: Secondary | ICD-10-CM

## 2011-08-31 MED ORDER — ALPRAZOLAM 0.25 MG PO TABS: 0.2500 mg | ORAL_TABLET | Freq: Three times a day (TID) | ORAL | Status: DC | PRN

## 2011-08-31 MED ORDER — CITALOPRAM HYDROBROMIDE 20 MG PO TABS: 20.0000 mg | ORAL_TABLET | Freq: Every day | ORAL | Status: DC

## 2011-08-31 NOTE — Assessment & Plan Note (Signed)
situationally induced - in counseling with Colen Darling started citalopram 03/2011, titrate up 05/2011 - continue prn alpraz to help manage symptoms -  follow up 3 months, sooner if probelms

## 2011-08-31 NOTE — Patient Instructions (Signed)
It was good to see you today. Continue citalopram 20mg  daily and continue alprazolam as needed for depression and anxiety symptoms Refill on medication(s) as discussed today.  Continue counseling efforts as ongoing followup here in 3-6 months for medication and symptoms review;  call sooner if problems

## 2011-08-31 NOTE — Progress Notes (Signed)
  Subjective:    Patient ID: Barbara Ingram, female    DOB: 03-Oct-1965, 46 y.o.   MRN: [id number]  HPI  follow up on depression symptoms Onset fall 2012 Situationally induced, gradually worsening over time No prior history of same Manifest with easy tearfulness, loss of interest in previously pleasurable things, irritability and poor sleep Also flares of panic symptoms which are improved with use of prn low-dose Xanax In personal counseling - seeing Colen Darling started low dose SSRI medication 03/2011 to help with control of symptoms , increase dose 05/2011> feeels improved Denies suicide ideation  Past Medical History  Diagnosis Date  . LIPOMAS, MULTIPLE   . THYROID STIMULATING HORMONE, ABNORMAL 2012  . ADENOCARCINOMA, BREAST dx 04/2003    lumpectomy, B mastectomy, no chemo  . ALLERGIC RHINITIS   . IBS (irritable bowel syndrome)   . Depression fall 2012     Review of Systems  Constitutional: Negative for fever and unexpected weight change.  Psychiatric/Behavioral: Positive for dysphoric mood. Negative for hallucinations, behavioral problems and self-injury. The patient is nervous/anxious. The patient is not hyperactive.        Objective:   Physical Exam  BP 108/68  Pulse 94  Temp(Src) 98.5 F (36.9 C) (Oral)  Resp 16  Ht 5\' 11"  (1.803 m)  Wt 129 lb (58.514 kg)  BMI 17.99 kg/m2  SpO2 96%  LMP 08/10/2011 Wt Readings from Last 3 Encounters:  08/31/11 129 lb (58.514 kg)  05/20/11 129 lb 12.8 oz (58.877 kg)  03/25/11 133 lb 9.6 oz (60.601 kg)   Gen: Thin, well-nourished but emotional Lungs: CTA B CV: RRR, no edema Psyc:  Normal mood and affect, judgment and insight appropriate, less depressed and anxious  Lab Results  Component Value Date   WBC 4.7 01/04/2011   HGB 13.8 01/04/2011   HCT 40.6 01/04/2011   PLT 246 01/04/2011   GLUCOSE 124* 01/04/2011   GLUCOSE 124* 01/04/2011   CHOL 212* 06/03/2010   TRIG 66.0 06/03/2010   HDL 78.00 06/03/2010   LDLDIRECT 112.7  06/03/2010   ALT 14 01/04/2011   ALT 14 01/04/2011   AST 21 01/04/2011   AST 21 01/04/2011   NA 136 01/04/2011   NA 136 01/04/2011   K 4.0 01/04/2011   K 4.0 01/04/2011   CL 103 01/04/2011   CL 103 01/04/2011   CREATININE 0.97 01/04/2011   CREATININE 0.97 01/04/2011   BUN 14 01/04/2011   BUN 14 01/04/2011   CO2 21 01/04/2011   CO2 21 01/04/2011   TSH 0.07* 06/03/2010       Assessment & Plan:  See problem list. Medications and labs reviewed today.

## 2011-11-02 ENCOUNTER — Other Ambulatory Visit: Payer: 59

## 2011-11-02 ENCOUNTER — Ambulatory Visit (INDEPENDENT_AMBULATORY_CARE_PROVIDER_SITE_OTHER): Payer: 59 | Admitting: Internal Medicine

## 2011-11-02 ENCOUNTER — Encounter: Payer: Self-pay | Admitting: Internal Medicine

## 2011-11-02 VITALS — BP 112/68 | HR 74 | Temp 98.1°F | Ht 71.0 in | Wt 128.8 lb

## 2011-11-02 DIAGNOSIS — Z113 Encounter for screening for infections with a predominantly sexual mode of transmission: Secondary | ICD-10-CM

## 2011-11-02 DIAGNOSIS — F329 Major depressive disorder, single episode, unspecified: Secondary | ICD-10-CM

## 2011-11-02 DIAGNOSIS — F3289 Other specified depressive episodes: Secondary | ICD-10-CM

## 2011-11-02 DIAGNOSIS — Z124 Encounter for screening for malignant neoplasm of cervix: Secondary | ICD-10-CM

## 2011-11-02 DIAGNOSIS — F32A Depression, unspecified: Secondary | ICD-10-CM

## 2011-11-02 NOTE — Assessment & Plan Note (Signed)
situationally induced - in counseling with Colen Darling started citalopram 03/2011, titrate up 05/2011 - Has weaned off since 08/2011 and feels she is doing well Will continue prn alpraz to help manage insomnia/stress symptoms -  follow up 3 months, sooner if probelms

## 2011-11-02 NOTE — Patient Instructions (Signed)
It was good to see you today. Test(s) ordered today. Your results will be called to you after review (48-72hours after test completion). If any changes need to be made, you will be notified at that time. we'll make referral to new gynecology at [facility] . Our office will contact you regarding appointment(s) once made.

## 2011-11-02 NOTE — Progress Notes (Signed)
  Subjective:    Patient ID: Barbara Ingram, female    DOB: 05-13-1966, 46 y.o.   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 LIPOMAS, MULTIPLE   . THYROID STIMULATING HORMONE, ABNORMAL 2012  . ADENOCARCINOMA, BREAST dx 04/2003    lumpectomy, B mastectomy, no chemo  . ALLERGIC RHINITIS   . IBS (irritable bowel syndrome)   . Depression fall 2012     Review of Systems  Constitutional: Negative for fever and fatigue.  Genitourinary: Negative for dysuria, vaginal discharge, difficulty urinating, genital sores, vaginal pain, pelvic pain and dyspareunia.       Objective:   Physical Exam BP 112/68  Pulse 74  Temp 98.1 F (36.7 C) (Oral)  Ht 5\' 11"  (1.803 m)  Wt 128 lb 12.8 oz (58.423 kg)  BMI 17.96 kg/m2  SpO2 98% Wt Readings from Last 3 Encounters:  11/02/11 128 lb 12.8 oz (58.423 kg)  08/31/11 129 lb (58.514 kg)  05/20/11 129 lb 12.8 oz (58.877 kg)   Constitutional: She appears well-developed and well-nourished. No distress.  Neck: Normal range of motion. Neck supple. No JVD present. No thyromegaly present.  Cardiovascular: Normal rate, regular rhythm and normal heart sounds.  No murmur heard. No BLE edema. Pulmonary/Chest: Effort normal and breath sounds normal. No respiratory distress. She has no wheezes.  GU - defer to gyn Skin: Skin is warm and dry. No rash noted. No erythema.  Psychiatric: She has a normal mood and affect. Her behavior is normal. Judgment and thought content normal.       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

## 2011-11-03 LAB — CHLAMYDIA PROBE AMPLIFICATION, URINE: Chlamydia, Swab/Urine, PCR: NEGATIVE

## 2011-11-03 LAB — GC PROBE AMPLIFICATION, URINE: GC Probe Amp, Urine: NEGATIVE

## 2011-11-03 LAB — HSV 2 ANTIBODY, IGG: HSV 2 Glycoprotein G Ab, IgG: 0.1 IV

## 2011-11-03 LAB — HSV 1 ANTIBODY, IGG: HSV 1 Glycoprotein G Ab, IgG: 12.2 IV — ABNORMAL HIGH

## 2011-11-03 LAB — RPR

## 2011-11-03 LAB — HIV ANTIBODY (ROUTINE TESTING W REFLEX): HIV: NONREACTIVE

## 2012-01-04 ENCOUNTER — Other Ambulatory Visit: Payer: 59 | Admitting: Lab

## 2012-01-04 ENCOUNTER — Ambulatory Visit: Payer: 59 | Admitting: Oncology

## 2012-01-07 ENCOUNTER — Encounter (HOSPITAL_COMMUNITY)
Admission: EM | Disposition: A | Discharge status: Home / Self Care | Payer: Self-pay | Source: Home / Self Care | Attending: Orthopedic Surgery

## 2012-01-07 ENCOUNTER — Encounter (HOSPITAL_COMMUNITY): Payer: Self-pay | Admitting: Anesthesiology

## 2012-01-07 ENCOUNTER — Emergency Department (HOSPITAL_COMMUNITY): Payer: 59 | Admitting: Anesthesiology

## 2012-01-07 ENCOUNTER — Encounter (HOSPITAL_COMMUNITY): Payer: Self-pay | Admitting: Emergency Medicine

## 2012-01-07 ENCOUNTER — Inpatient Hospital Stay (HOSPITAL_COMMUNITY)
Admission: EM | Admit: 2012-01-07 | Discharge: 2012-01-08 | DRG: 502 | Disposition: A | Discharge status: Home / Self Care | Payer: 59 | Attending: Orthopedic Surgery | Admitting: Orthopedic Surgery

## 2012-01-07 ENCOUNTER — Emergency Department (HOSPITAL_COMMUNITY): Payer: 59

## 2012-01-07 DIAGNOSIS — Y998 Other external cause status: Secondary | ICD-10-CM

## 2012-01-07 DIAGNOSIS — Z853 Personal history of malignant neoplasm of breast: Secondary | ICD-10-CM

## 2012-01-07 DIAGNOSIS — D1739 Benign lipomatous neoplasm of skin and subcutaneous tissue of other sites: Secondary | ICD-10-CM | POA: Diagnosis present

## 2012-01-07 DIAGNOSIS — S62102A Fracture of unspecified carpal bone, left wrist, initial encounter for closed fracture: Secondary | ICD-10-CM

## 2012-01-07 DIAGNOSIS — F329 Major depressive disorder, single episode, unspecified: Secondary | ICD-10-CM | POA: Diagnosis present

## 2012-01-07 DIAGNOSIS — S52599A Other fractures of lower end of unspecified radius, initial encounter for closed fracture: Principal | ICD-10-CM | POA: Diagnosis present

## 2012-01-07 DIAGNOSIS — F3289 Other specified depressive episodes: Secondary | ICD-10-CM | POA: Diagnosis present

## 2012-01-07 DIAGNOSIS — W010XXA Fall on same level from slipping, tripping and stumbling without subsequent striking against object, initial encounter: Secondary | ICD-10-CM | POA: Diagnosis present

## 2012-01-07 DIAGNOSIS — Y9239 Other specified sports and athletic area as the place of occurrence of the external cause: Secondary | ICD-10-CM

## 2012-01-07 HISTORY — PX: ORIF WRIST FRACTURE: SHX2133

## 2012-01-07 LAB — CBC WITH DIFFERENTIAL/PLATELET
Basophils Absolute: 0 10*3/uL (ref 0.0–0.1)
Basophils Relative: 0 % (ref 0–1)
Eosinophils Absolute: 0 10*3/uL (ref 0.0–0.7)
Eosinophils Relative: 0 % (ref 0–5)
HCT: 36.2 % (ref 36.0–46.0)
Hemoglobin: 12.5 g/dL (ref 12.0–15.0)
Lymphocytes Relative: 9 % — ABNORMAL LOW (ref 12–46)
Lymphs Abs: 0.7 10*3/uL (ref 0.7–4.0)
MCH: 30 pg (ref 26.0–34.0)
MCHC: 34.5 g/dL (ref 30.0–36.0)
MCV: 86.8 fL (ref 78.0–100.0)
Monocytes Absolute: 0.2 10*3/uL (ref 0.1–1.0)
Monocytes Relative: 2 % — ABNORMAL LOW (ref 3–12)
Neutro Abs: 7 10*3/uL (ref 1.7–7.7)
Neutrophils Relative %: 88 % — ABNORMAL HIGH (ref 43–77)
Platelets: 217 10*3/uL (ref 150–400)
RBC: 4.17 MIL/uL (ref 3.87–5.11)
RDW: 13.1 % (ref 11.5–15.5)
WBC: 7.9 10*3/uL (ref 4.0–10.5)

## 2012-01-07 SURGERY — OPEN REDUCTION INTERNAL FIXATION (ORIF) WRIST FRACTURE
Anesthesia: General | Site: Wrist | Laterality: Left | Wound class: Clean

## 2012-01-07 MED ORDER — DEXTROSE 5 % IV SOLN
INTRAVENOUS | Status: DC | PRN
  Administered 2012-01-07: 15:00:00 via INTRAVENOUS

## 2012-01-07 MED ORDER — DEXAMETHASONE SODIUM PHOSPHATE 4 MG/ML IJ SOLN
INTRAMUSCULAR | Status: DC | PRN
  Administered 2012-01-07: 4 mg via INTRAVENOUS

## 2012-01-07 MED ORDER — MEPERIDINE HCL 25 MG/ML IJ SOLN: 6.2500 mg | INTRAMUSCULAR | Status: DC | PRN

## 2012-01-07 MED ORDER — FENTANYL CITRATE 0.05 MG/ML IJ SOLN
INTRAMUSCULAR | Status: DC | PRN
Start: 2012-01-07 — End: 2012-01-07
  Administered 2012-01-07 (×2): 50 ug via INTRAVENOUS
  Administered 2012-01-07: 100 ug via INTRAVENOUS
  Administered 2012-01-07 (×2): 50 ug via INTRAVENOUS

## 2012-01-07 MED ORDER — PROMETHAZINE HCL 25 MG RE SUPP: 12.5000 mg | Freq: Four times a day (QID) | RECTAL | Status: DC | PRN

## 2012-01-07 MED ORDER — MORPHINE SULFATE 2 MG/ML IJ SOLN: 1.0000 mg | INTRAMUSCULAR | Status: DC | PRN

## 2012-01-07 MED ORDER — HYDROMORPHONE HCL PF 1 MG/ML IJ SOLN: 0.2500 mg | INTRAMUSCULAR | Status: DC | PRN

## 2012-01-07 MED ORDER — LIDOCAINE HCL (CARDIAC) 20 MG/ML IV SOLN
INTRAVENOUS | Status: DC | PRN
  Administered 2012-01-07: 60 mg via INTRAVENOUS
  Administered 2012-01-07: 40 mg via INTRAVENOUS

## 2012-01-07 MED ORDER — ROPIVACAINE HCL 5 MG/ML IJ SOLN
INTRAMUSCULAR | Status: DC | PRN
  Administered 2012-01-07: 30 mL via EPIDURAL

## 2012-01-07 MED ORDER — ONDANSETRON HCL 4 MG/2ML IJ SOLN: 4.0000 mg | Freq: Four times a day (QID) | INTRAMUSCULAR | Status: DC | PRN

## 2012-01-07 MED ORDER — VANCOMYCIN HCL IN DEXTROSE 1-5 GM/200ML-% IV SOLN
1000.0000 mg | INTRAVENOUS | Status: DC
  Filled 2012-01-07: qty 200

## 2012-01-07 MED ORDER — DIPHENHYDRAMINE HCL 25 MG PO CAPS: 25.0000 mg | ORAL_CAPSULE | Freq: Four times a day (QID) | ORAL | Status: DC | PRN

## 2012-01-07 MED ORDER — ACETAMINOPHEN 10 MG/ML IV SOLN
INTRAVENOUS | Status: DC | PRN
  Administered 2012-01-07: 1000 mg via INTRAVENOUS

## 2012-01-07 MED ORDER — METHOCARBAMOL 100 MG/ML IJ SOLN
500.0000 mg | Freq: Four times a day (QID) | INTRAVENOUS | Status: DC | PRN
  Filled 2012-01-07: qty 5

## 2012-01-07 MED ORDER — METHOCARBAMOL 500 MG PO TABS
500.0000 mg | ORAL_TABLET | Freq: Four times a day (QID) | ORAL | Status: DC | PRN
  Administered 2012-01-08: 500 mg via ORAL
  Filled 2012-01-07 (×2): qty 1

## 2012-01-07 MED ORDER — LACTATED RINGERS IV SOLN
INTRAVENOUS | Status: DC
  Administered 2012-01-07: 19:00:00 via INTRAVENOUS

## 2012-01-07 MED ORDER — PROPOFOL 10 MG/ML IV EMUL
INTRAVENOUS | Status: DC | PRN
  Administered 2012-01-07: 110 mg via INTRAVENOUS

## 2012-01-07 MED ORDER — FAMOTIDINE 20 MG PO TABS
20.0000 mg | ORAL_TABLET | Freq: Two times a day (BID) | ORAL | Status: DC | PRN
  Filled 2012-01-07: qty 1

## 2012-01-07 MED ORDER — DROPERIDOL 2.5 MG/ML IJ SOLN
INTRAMUSCULAR | Status: DC | PRN
  Administered 2012-01-07: 0.625 mg via INTRAVENOUS

## 2012-01-07 MED ORDER — OXYCODONE HCL 5 MG PO TABS
5.0000 mg | ORAL_TABLET | ORAL | Status: DC | PRN
  Administered 2012-01-08 (×3): 5 mg via ORAL
  Filled 2012-01-07: qty 1
  Filled 2012-01-07: qty 2
  Filled 2012-01-07 (×2): qty 1

## 2012-01-07 MED ORDER — VITAMIN C 500 MG PO TABS
1000.0000 mg | ORAL_TABLET | Freq: Every day | ORAL | Status: DC
  Administered 2012-01-08: 1000 mg via ORAL
  Filled 2012-01-07 (×2): qty 2

## 2012-01-07 MED ORDER — HYDROMORPHONE HCL PF 1 MG/ML IJ SOLN
1.0000 mg | Freq: Once | INTRAMUSCULAR | Status: AC
  Administered 2012-01-07: 1 mg via INTRAVENOUS
  Filled 2012-01-07: qty 1

## 2012-01-07 MED ORDER — MIDAZOLAM HCL 5 MG/5ML IJ SOLN
INTRAMUSCULAR | Status: DC | PRN
  Administered 2012-01-07: 2 mg via INTRAVENOUS

## 2012-01-07 MED ORDER — NEOSTIGMINE METHYLSULFATE 1 MG/ML IJ SOLN
INTRAMUSCULAR | Status: DC | PRN
  Administered 2012-01-07: 2 mg via INTRAVENOUS

## 2012-01-07 MED ORDER — VANCOMYCIN HCL 1000 MG IV SOLR
1000.0000 mg | INTRAVENOUS | Status: DC | PRN
  Administered 2012-01-07: 1000 mg via INTRAVENOUS

## 2012-01-07 MED ORDER — LACTATED RINGERS IV SOLN
INTRAVENOUS | Status: DC | PRN
  Administered 2012-01-07 (×2): via INTRAVENOUS

## 2012-01-07 MED ORDER — GLYCOPYRROLATE 0.2 MG/ML IJ SOLN
INTRAMUSCULAR | Status: DC | PRN
  Administered 2012-01-07: 0.4 mg via INTRAVENOUS
  Administered 2012-01-07: .3 mg via INTRAVENOUS

## 2012-01-07 MED ORDER — EPHEDRINE SULFATE 50 MG/ML IJ SOLN
INTRAMUSCULAR | Status: DC | PRN
  Administered 2012-01-07: 5 mg via INTRAVENOUS

## 2012-01-07 MED ORDER — DOCUSATE SODIUM 100 MG PO CAPS
100.0000 mg | ORAL_CAPSULE | Freq: Two times a day (BID) | ORAL | Status: DC
  Administered 2012-01-08: 100 mg via ORAL
  Filled 2012-01-07 (×2): qty 1

## 2012-01-07 MED ORDER — SUCCINYLCHOLINE CHLORIDE 20 MG/ML IJ SOLN
INTRAMUSCULAR | Status: DC | PRN
  Administered 2012-01-07: 100 mg via INTRAVENOUS

## 2012-01-07 MED ORDER — ONDANSETRON HCL 4 MG/2ML IJ SOLN
INTRAMUSCULAR | Status: DC | PRN
  Administered 2012-01-07: 4 mg via INTRAVENOUS

## 2012-01-07 MED ORDER — ONDANSETRON HCL 4 MG PO TABS: 4.0000 mg | ORAL_TABLET | Freq: Four times a day (QID) | ORAL | Status: DC | PRN

## 2012-01-07 MED ORDER — ALPRAZOLAM 0.5 MG PO TABS
0.5000 mg | ORAL_TABLET | Freq: Four times a day (QID) | ORAL | Status: DC | PRN
  Administered 2012-01-07: 0.5 mg via ORAL
  Filled 2012-01-07: qty 1

## 2012-01-07 MED ORDER — VECURONIUM BROMIDE 10 MG IV SOLR
INTRAVENOUS | Status: DC | PRN
  Administered 2012-01-07: 5 mg via INTRAVENOUS

## 2012-01-07 MED ORDER — ONDANSETRON HCL 4 MG/2ML IJ SOLN: 4.0000 mg | Freq: Once | INTRAMUSCULAR | Status: DC | PRN

## 2012-01-07 SURGICAL SUPPLY — 65 items
BANDAGE ELASTIC 3 VELCRO ST LF (GAUZE/BANDAGES/DRESSINGS) ×2 IMPLANT
BANDAGE ELASTIC 4 VELCRO ST LF (GAUZE/BANDAGES/DRESSINGS) ×4 IMPLANT
BANDAGE GAUZE 4  KLING STR (GAUZE/BANDAGES/DRESSINGS) ×2 IMPLANT
BANDAGE GAUZE ELAST BULKY 4 IN (GAUZE/BANDAGES/DRESSINGS) ×2 IMPLANT
BIT DRILL 2 FAST STEP (BIT) ×2 IMPLANT
BIT DRILL 2.5X4 QC (BIT) ×2 IMPLANT
BLADE SURG ROTATE 9660 (MISCELLANEOUS) IMPLANT
BNDG ESMARK 4X9 LF (GAUZE/BANDAGES/DRESSINGS) ×2 IMPLANT
CLOTH BEACON ORANGE TIMEOUT ST (SAFETY) ×2 IMPLANT
CORDS BIPOLAR (ELECTRODE) ×2 IMPLANT
COVER SURGICAL LIGHT HANDLE (MISCELLANEOUS) ×2 IMPLANT
CUFF TOURNIQUET SINGLE 18IN (TOURNIQUET CUFF) ×2 IMPLANT
CUFF TOURNIQUET SINGLE 24IN (TOURNIQUET CUFF) IMPLANT
DECANTER SPIKE VIAL GLASS SM (MISCELLANEOUS) IMPLANT
DRAIN TLS ROUND 10FR (DRAIN) IMPLANT
DRAPE INCISE IOBAN 66X45 STRL (DRAPES) IMPLANT
DRAPE OEC MINIVIEW 54X84 (DRAPES) ×2 IMPLANT
DRAPE U-SHAPE 47X51 STRL (DRAPES) IMPLANT
DRSG ADAPTIC 3X8 NADH LF (GAUZE/BANDAGES/DRESSINGS) ×2 IMPLANT
DRSG EMULSION OIL 3X3 NADH (GAUZE/BANDAGES/DRESSINGS) ×2 IMPLANT
ELECT REM PT RETURN 9FT ADLT (ELECTROSURGICAL)
ELECTRODE REM PT RTRN 9FT ADLT (ELECTROSURGICAL) IMPLANT
GAUZE XEROFORM 1X8 LF (GAUZE/BANDAGES/DRESSINGS) ×2 IMPLANT
GLOVE BIOGEL M STRL SZ7.5 (GLOVE) ×2 IMPLANT
GLOVE SS BIOGEL STRL SZ 8 (GLOVE) ×2 IMPLANT
GLOVE SUPERSENSE BIOGEL SZ 8 (GLOVE) ×2
GOWN PREVENTION PLUS XLARGE (GOWN DISPOSABLE) IMPLANT
GOWN STRL NON-REIN LRG LVL3 (GOWN DISPOSABLE) ×2 IMPLANT
GOWN STRL REIN XL XLG (GOWN DISPOSABLE) ×2 IMPLANT
KIT BASIN OR (CUSTOM PROCEDURE TRAY) ×2 IMPLANT
KIT ROOM TURNOVER OR (KITS) ×2 IMPLANT
LOOP VESSEL MAXI BLUE (MISCELLANEOUS) IMPLANT
MANIFOLD NEPTUNE II (INSTRUMENTS) ×2 IMPLANT
NEEDLE 22X1 1/2 (OR ONLY) (NEEDLE) IMPLANT
NEEDLE BLUNT 16X1.5 OR ONLY (NEEDLE) IMPLANT
NS IRRIG 1000ML POUR BTL (IV SOLUTION) ×2 IMPLANT
PACK ORTHO EXTREMITY (CUSTOM PROCEDURE TRAY) ×2 IMPLANT
PAD ARMBOARD 7.5X6 YLW CONV (MISCELLANEOUS) ×4 IMPLANT
PAD CAST 4YDX4 CTTN HI CHSV (CAST SUPPLIES) ×1 IMPLANT
PADDING CAST COTTON 4X4 STRL (CAST SUPPLIES) ×1
PEG SUBCHONDRAL SMOOTH 2.0X20 (Peg) ×2 IMPLANT
PEG SUBCHONDRAL SMOOTH 2.0X22 (Peg) ×8 IMPLANT
PLATE STAN 24.4X59.5 LT (Plate) ×2 IMPLANT
PUTTY ORTHOBLAST II 5CC (Orthopedic Implant) ×2 IMPLANT
SCREW BN 12X3.5XNS CORT TI (Screw) ×1 IMPLANT
SCREW CORT 3.5X10 LNG (Screw) ×6 IMPLANT
SCREW CORT 3.5X12 (Screw) ×1 IMPLANT
SCREW PEG LOCK 2.5X18 (Peg) ×2 IMPLANT
SCREW PEG LOCK 2.5X20 (Peg) ×2 IMPLANT
SPLINT FIBERGLASS 4X30 (CAST SUPPLIES) ×2 IMPLANT
SPONGE GAUZE 4X4 12PLY (GAUZE/BANDAGES/DRESSINGS) ×2 IMPLANT
SPONGE LAP 4X18 X RAY DECT (DISPOSABLE) ×2 IMPLANT
STAPLER VISISTAT 35W (STAPLE) IMPLANT
SUCTION FRAZIER TIP 10 FR DISP (SUCTIONS) ×2 IMPLANT
SUT ETHILON 4 0 PS 2 18 (SUTURE) IMPLANT
SUT PROLENE 4 0 PS 2 18 (SUTURE) IMPLANT
SUT VIC AB 3-0 FS2 27 (SUTURE) IMPLANT
SYR CONTROL 10ML LL (SYRINGE) IMPLANT
SYSTEM CHEST DRAIN TLS 7FR (DRAIN) ×2 IMPLANT
TOWEL OR 17X24 6PK STRL BLUE (TOWEL DISPOSABLE) ×2 IMPLANT
TOWEL OR 17X26 10 PK STRL BLUE (TOWEL DISPOSABLE) ×2 IMPLANT
TUBE CONNECTING 12X1/4 (SUCTIONS) ×2 IMPLANT
TUBE EVACUATION TLS (MISCELLANEOUS) ×2 IMPLANT
WATER STERILE IRR 1000ML POUR (IV SOLUTION) ×2 IMPLANT
YANKAUER SUCT BULB TIP NO VENT (SUCTIONS) IMPLANT

## 2012-01-07 NOTE — Op Note (Signed)
NAMETAMAIYA, BUMP NO.:  [id number]  MEDICAL RECORD NO.:  [id number]  LOCATION:  [facility]                         FACILITY:  [facility]  PHYSICIAN:  Dionne Ano. Jawana Reagor, M.D.DATE OF BIRTH:  1966-02-02  DATE OF PROCEDURE: DATE OF DISCHARGE:                              OPERATIVE REPORT   PREOPERATIVE DIAGNOSIS: 1. Comminuted complex greater than 5-part intra-articular distal     radius fracture, left upper extremity, status post fall. 2. Right forearm mass.  POSTOPERATIVE DIAGNOSIS: 1. Comminuted complex greater than 5-part intra-articular distal     radius fracture, left upper extremity, status post fall. 2. Right forearm mass.  PROCEDURE: 1. Open reduction and internal fixation of left comminuted complex     greater than 5-part intra-articular distal radius fracture with     osteoblast/allograft bone graft and DVR plate and screw fixation. 2. Sliding brachioradialis tenotomy, left wrist. 3. Stress radiography. 4. Removal of mass, right forearm greater than 1.5 cm, consistent with     lipoma.  SURGEON:  Dionne Ano. Amanda Pea, MD  ASSISTANT:  None.  COMPLICATION:  None.  ANESTHESIA:  General with preoperative block administered by Kaylyn Layer. Michelle Piper, M.D.  TOURNIQUET TIME:  Less than an hour.  DRAINS:  One.  INDICATIONS FOR THE PROCEDURE:  This patient is a 46 year old female who presents with above admission diagnosis.  I have counseled her in regards to risks and benefits of surgery including risk of infection, bleeding, anesthesia, damage to muscle structures, and failure of surgery to accomplish its intended goals of relieving symptoms and restoring function.  With this in mind, she desires to proceed.  All questions have been encouraged and answered preoperatively.  OPERATIVE PROCEDURE:  The patient was seen by myself, anesthesia, taken to the operating suite, underwent smooth induction block anesthetic by Dr. Michelle Piper in the holding area after consent.  In  the operative arena, she underwent a general anesthetic.  Time-out was called.  She was prepped and draped in the usual sterile fashion with Betadine scrub and paint, which I performed by myself given the fragility of her arm and the comminuted nature of her fracture.  Thorough Betadine scrub and paint was applied.  Following this, sterile drapes were placed.  Final time-out was called.  The pre and postop check list for complete nonoperative course, verified all issues preoperatively with the patient in terms of the operative agenda.  Once under anesthesia and a sterile field prep and drape, the patient then underwent a very careful and cautious approach to the wrist.  Finger trap traction was placed. Tourniquet was insufflated.  Volar and radial approach was made.  FCR tendon sheath was incised palmarly and dorsally.  Radial artery was protected at all times as was the carpal canal contents, which were swept ulnarly.  I incised the pronator in an L-shaped fashion. Dissected down to the bone.  She had a very comminuted complex fracture. This was pieced together.  She had a large defect, and this was filled with osteoblast bone graft.  This is an allograft bone graft as I discussed with the patient preoperatively.  Following this, the patient had the radial styloid manipulated into place, held with  Kirschner wire and a sliding brachioradialis tenotomy was accomplished to take tension off of this and prevent displacement. Following this, a DVR plate and screw construct was placed allowing restoration of radial height, radial inclination and volar tilt.  I was pleased with the radiographic parameters as they were anatomic.  The distal radioulnar joint was stable, and the radiocarpal joint was without gross interosseous ligament injury.  Following this, the patient then underwent a very careful and cautious irrigation followed by closure of the pronator.  I should note the DVR plate and  screws were applied with standard AO technique without difficulty, and stress radiography revealed excellent position of the screws.  Final copy x-rays were taken for documentation.  Following this, the patient was placed to allow full range of motion, had good sturdy stability.  Despite the comminuted fracture, the patient had excellent stability, and I was pleased with this.  The pronator was closed with Vicryl.  Drain was placed.  The tourniquet was deflated prior to pronator closure.  Irrigation greater than 3 liters were placed in the wound to prevent infection, of course, and the patient underwent closure of the skin edge with Prolene.  Sterile dressing was applied.  Drain was hooked up to suction.  Volar fiberglass splint was applied, and she had excellent refill of soft compartments and no complications.  Following this, the patient had the right arm prepped draped in the sterile fashion, Betadine scrub and paint.  This was separate scrub and paint.  Following this, a 1 cm incision was made, very carefully tunneled about the proximal volar aspect of the forearm and dissected a lipoma, in my estimation greater than 2 cm.  This was sent for specimen appeared to be benign.  Following this, the wound was irrigated and closed with Prolene, sterile dressing was applied.  She tolerated the procedure well.  She was woken from anesthesia, and will be monitored closely.  We will plan for IV antibiotics, general postop observation according to our usual protocols.  I will wait for 4 weeks to begin gentle range of motion.  This will be very gentle under the supervision of our therapy department.  In addition to this, we will plan for strengthening at 8 weeks predicted on x-rays given the comminuted nature.  These notes have been discussed. All questions have been encouraged and answered.  It was a pleasure to participate in her care.  We look forward to participate in her postop  algorithm.     Dionne Ano. Amanda Pea, M.D.    [facility]  D:  01/07/2012  T:  01/07/2012  Job:  [id number]

## 2012-01-07 NOTE — ED Provider Notes (Addendum)
Medical screening examination/treatment/procedure(s) were conducted as a shared visit with non-physician practitioner(s) and myself.  I personally evaluated the patient during the encounter RHD female FOOSH of left wrist at gym.  Pain and deformity.  Moves digits.  xr comminuted intraarticular fx.  Spoke with dr. Amanda Pea.  He will eval pt and probably take to or.   preop ecg and labs ordered  Cheri Guppy, MD 01/07/12 1342  ECG Normal sinus rhythm at 82 beats per minute. Normal axis. Left atrial enlargement. Normal intervals. Normal.  ST and T-wave   Cheri Guppy, MD 01/07/12 [phone]

## 2012-01-07 NOTE — ED Notes (Signed)
[facility] gave fentanyl

## 2012-01-07 NOTE — ED Notes (Signed)
This rn spoke with OR RN- advised needed to draw blood and pt double mastectomy with lymph node removal so our lab staff cannot draw from right side.  Also provided sbar rpt with med admin details.

## 2012-01-07 NOTE — Op Note (Signed)
See Dictation # [id number] Dominica Severin MD

## 2012-01-07 NOTE — Progress Notes (Signed)
Orthopedic Tech Progress Note Patient Details:  Barbara Ingram 01/25/1966 [id number]  Ortho Devices Type of Ortho Device: Arm foam sling;Other (comment) (kuzman sling) Ortho Device/Splint Location: (L) UE Ortho Device/Splint Interventions: Application   Jennye Moccasin 01/07/2012, 10:46 PM

## 2012-01-07 NOTE — Progress Notes (Addendum)
ANTIBIOTIC CONSULT NOTE - INITIAL  Pharmacy Consult for Vancomycin Indication: post-op prophylaxis  Allergies  Allergen Reactions  . Penicillins Other (See Comments)    Reaction unknown    Patient Measurements:   Actual Body Weight: 58.4 Kg  Vital Signs: Temp: 98.5 F (36.9 C) (08/24 1853) Temp src: Oral (08/24 1130) BP: 126/64 mmHg (08/24 1853) Pulse Rate: 81  (08/24 1853) Intake/Output from previous day:   Intake/Output from this shift: Total I/O In: 1800 [I.V.:1800] Out: 15 [Drains:15]  Labs: No results found for this basename: WBC:3,HGB:3,PLT:3,LABCREA:3,CREATININE:3 in the last 72 hours The CrCl is unknown because both a height and weight (above a minimum accepted value) are required for this calculation. No results found for this basename: VANCOTROUGH:2,VANCOPEAK:2,VANCORANDOM:2,GENTTROUGH:2,GENTPEAK:2,GENTRANDOM:2,TOBRATROUGH:2,TOBRAPEAK:2,TOBRARND:2,AMIKACINPEAK:2,AMIKACINTROU:2,AMIKACIN:2, in the last 72 hours   Microbiology: No results found for this or any previous visit (from the past 720 hour(s)).  Medical History: Past Medical History  Diagnosis Date  . LIPOMAS, MULTIPLE   . THYROID STIMULATING HORMONE, ABNORMAL 2012  . ADENOCARCINOMA, BREAST dx 04/2003    lumpectomy, B mastectomy, no chemo  . ALLERGIC RHINITIS   . IBS (irritable bowel syndrome)   . Depression fall 2012    Medications:  Prescriptions prior to admission  Medication Sig Dispense Refill  . Ascorbic Acid (VITAMIN C PO) Take 1 tablet by mouth daily.      . Cholecalciferol (VITAMIN D PO) Take 1 tablet by mouth daily.      . Coenzyme Q10 (CO Q 10 PO) Take 1 tablet by mouth daily.      . fish oil-omega-3 fatty acids 1000 MG capsule Take 2 g by mouth daily.      . Multiple Vitamin (MULTIVITAMIN WITH MINERALS) TABS Take 1 tablet by mouth daily.      . Multiple Vitamins-Minerals (ZINC PO) Take 1 tablet by mouth daily.       Assessment: Patient is s/p repair of wrist fracture, to receive  Vanc x 48 hours. Noted patient received pre-op Vanc (1gm) at 1600 today. No labs noted this admit, but Scr in August 2012 was 0.97 mg/dl.  Goal of Therapy:  Vancomycin trough level 10-15 mcg/ml  Plan:  - Vancomycin 1gm IV q 24h x 2 doses- next due at 1600 8/25 - Pharmacy will sign off on 8/26  Thanks, Jameela Michna K. Allena Katz, PharmD, BCPS.  Clinical Pharmacist Pager [phone]. 01/07/2012 7:03 PM

## 2012-01-07 NOTE — H&P (Signed)
Barbara Ingram is an 46 y.o. female.   Chief Complaint: Displaced comminuted left distal radius fracture. Right forearm mass. HPI: Patient was at the gym slipped and fell onto an outstretched left upper extremity sustaining a comminuted complex and articular distal radius fracture. She presents for surgical management and reconstruction understanding the risk and benefits. She also has a right forearm mass which I've seen the past suspicious for a lipoma that she would like have removed concomitantly  .Barbara KitchenPatient presents for evaluation and treatment of the of their upper extremity predicament. The patient denies neck back chest or of abdominal pain. The patient notes that they have no lower extremity problems. The patient from primarily complains of the upper extremity pain noted.  Past Medical History  Diagnosis Date  . LIPOMAS, MULTIPLE   . THYROID STIMULATING HORMONE, ABNORMAL 2012  . ADENOCARCINOMA, BREAST dx 04/2003    lumpectomy, B mastectomy, no chemo  . ALLERGIC RHINITIS   . IBS (irritable bowel syndrome)   . Depression fall 2012    Past Surgical History  Procedure Date  . Breast biopsy 04/2003  . Breast lumpectomy 05/2003  . Mastectomy 05/2003    Bilateral  . Breast reconstruction   . Cesarean section     Family History  Problem Relation Age of Onset  . Chronic bronchitis Mother   . Hypertension Father   . Hyperlipidemia Father   . Kidney disease Father    Social History:  reports that she has never smoked. She has never used smokeless tobacco. She reports that she does not drink alcohol or use illicit drugs.  Allergies:  Allergies  Allergen Reactions  . Penicillins Other (See Comments)    Reaction unknown     (Not in a hospital admission)  No results found for this or any previous visit (from the past 48 hour(s)). Dg Wrist Complete Left  01/07/2012  *RADIOLOGY REPORT*  Clinical Data: Left wrist pain following injury.  LEFT WRIST - COMPLETE 3+ VIEW  Comparison: None   Findings: A comminuted impacted intra-articular distal radial fracture is noted with apex anterior angulation. There is no evidence of dislocation. No other acute bony abnormalities are present.  IMPRESSION: Comminuted impacted intra-articular distal radial fracture with angulation.   Original Report Authenticated By: Rosendo Gros, M.D.     Review of Systems  HENT: Negative.   Eyes: Negative.   Respiratory: Negative.   Cardiovascular: Negative.   Gastrointestinal: Negative.   Genitourinary: Negative.   Skin: Negative.   Neurological: Negative.   Endo/Heme/Allergies: Negative.     Blood pressure 120/75, pulse 92, temperature 98.1 F (36.7 C), temperature source Oral, resp. rate 20, last menstrual period 01/07/2012, SpO2 99.00%. Physical Exam  Patient has a right forearm mass without signs of infection .Barbara KitchenThe patient is alert and oriented in no acute distress the patient complains of pain in the affected upper extremity. The patient is noted to have a normal HEENT exam. Lung fields show equal chest expansion and no shortness of breath abdomen exam is nontender without distention. Lower extremity examination does not show any fracture dislocation or blood clot symptoms. Pelvis is stable neck and back are stable and nontender  Left wrist has a com complex FX with stable compartments and no signs of infection  Elbow and shoulder are stable Intact pulse and refill with intact sensation to the extremity Xrays reviewed  Will require ORIF       Assessment/Plan .Barbara KitchenWe are planning surgery for your upper extremity. The risk and benefits of  surgery include risk of bleeding infection anesthesia damage to normal structures and failure of the surgery to accomplish its intended goals of relieving symptoms and restoring function with this in mind we'll going to proceed. I have specifically discussed with the patient the pre-and postoperative regime and the does and don'ts and risk and benefits in  great detail. Risk and benefits of surgery also include risk of dystrophy chronic nerve pain failure of the healing process to go onto completion and other inherent risks of surgery The relavent the pathophysiology of the disease/injury process, as well as the alternatives for treatment and postoperative course of action has been discussed in great detail with the patient who desires to proceed.  We will do everything in our power to help you (the patient) restore function to the upper extremity. Is a pleasure to see this patient today.  I have discussed with patient all issues and plans  Will proceed ASAP given the nature of her fracture    Barbara Ingram III,Barbara Ingram M 01/07/2012, 2:25 PM

## 2012-01-07 NOTE — ED Notes (Signed)
Dr Louanne Belton surgeon at bedside.

## 2012-01-07 NOTE — ED Provider Notes (Signed)
History     CSN: [id number]  Arrival date & time 01/07/12  1122   First MD Initiated Contact with Patient 01/07/12 1140      Chief Complaint  Patient presents with  . Wrist Pain    left wrist deformity-post fall    (Consider location/radiation/quality/duration/timing/severity/associated sxs/prior treatment) Patient is a 46 y.o. female presenting with wrist pain. The history is provided by the patient.  Wrist Pain This is a new problem. The current episode started today. The problem occurs constantly. The problem has been unchanged. Pertinent negatives include no fever or numbness. Associated symptoms comments: She fell while at the gym on flexed left wrist causing pain and deformity..    Past Medical History  Diagnosis Date  . LIPOMAS, MULTIPLE   . THYROID STIMULATING HORMONE, ABNORMAL 2012  . ADENOCARCINOMA, BREAST dx 04/2003    lumpectomy, B mastectomy, no chemo  . ALLERGIC RHINITIS   . IBS (irritable bowel syndrome)   . Depression fall 2012    Past Surgical History  Procedure Date  . Breast biopsy 04/2003  . Breast lumpectomy 05/2003  . Mastectomy 05/2003    Bilateral  . Breast reconstruction   . Cesarean section     Family History  Problem Relation Age of Onset  . Chronic bronchitis Mother   . Hypertension Father   . Hyperlipidemia Father   . Kidney disease Father     History  Substance Use Topics  . Smoking status: Never Smoker   . Smokeless tobacco: Never Used   Comment: Married, spouse is Scientist, water quality ([employer])- 2 kids. Moved to [place] fall 2011 [place]  . Alcohol Use: No    OB History    Grav Para Term Preterm Abortions TAB SAB Ect Mult Living                  Review of Systems  Constitutional: Negative for fever.  Musculoskeletal:       See HPI.  Skin: Negative for wound.  Neurological: Negative for numbness.    Allergies  Penicillins  Home Medications   Current Outpatient Rx  Name Route Sig Dispense Refill  . VITAMIN  C PO Oral Take 1 tablet by mouth daily.    [name] VITAMIN D PO Oral Take 1 tablet by mouth daily.    . CO Q 10 PO Oral Take 1 tablet by mouth daily.    . OMEGA-3 FATTY ACIDS 1000 MG PO CAPS Oral Take 2 g by mouth daily.    . ADULT MULTIVITAMIN W/MINERALS CH Oral Take 1 tablet by mouth daily.    [name] ZINC PO Oral Take 1 tablet by mouth daily.      BP 120/75  Pulse 92  Temp 98.1 F (36.7 C) (Oral)  Resp 20  SpO2 99%  Physical Exam  Constitutional: She is oriented to person, place, and time. She appears well-developed and well-nourished.       Uncomfortable appearing.  Musculoskeletal:       Left wrist has bony deformity dorsally. Closed injury. Nontender proximal forearm and elbow. Voluntary movement to all 5 digits.  Neurological: She is alert and oriented to person, place, and time.       Normal sensation to light touch distal to injury.  Skin: Skin is warm and dry.    ED Course  Procedures (including critical care time)  Labs Reviewed - No data to display No results found.  Dg Wrist Complete Left  01/07/2012  *RADIOLOGY REPORT*  Clinical Data:  Left wrist pain following injury.  LEFT WRIST - COMPLETE 3+ VIEW  Comparison: None  Findings: A comminuted impacted intra-articular distal radial fracture is noted with apex anterior angulation. There is no evidence of dislocation. No other acute bony abnormalities are present.  IMPRESSION: Comminuted impacted intra-articular distal radial fracture with angulation.   Original Report Authenticated By: Rosendo Gros, M.D.    No diagnosis found. 1. Right intra-articular radial fracture   MDM  Pain is well controlled with Dilaudid. X-ray positive for surgical fracture - discussed with Dr. Amanda Pea who will be in to see patient.         Rodena Medin, PA-C 01/07/12 1437

## 2012-01-07 NOTE — ED Notes (Signed)
Pt arrives to ED via [facility] post fall at gym 1 hour PTA.  Left wrist deformity-PMS intact.  Pt caox3. NAD.

## 2012-01-07 NOTE — Anesthesia Postprocedure Evaluation (Signed)
Anesthesia Post Note  Patient: Barbara Ingram  Procedure(s) Performed: Procedure(s) (LRB): OPEN REDUCTION INTERNAL FIXATION (ORIF) WRIST FRACTURE (Left)  Anesthesia type: general  Patient location: PACU  Post pain: Pain level controlled  Post assessment: Patient's Cardiovascular Status Stable  Last Vitals:  Filed Vitals:   01/07/12 1745  BP:   Pulse:   Temp: 36.7 C  Resp:     Post vital signs: Reviewed and stable  Level of consciousness: sedated  Complications: No apparent anesthesia complications

## 2012-01-07 NOTE — Preoperative (Signed)
Beta Blockers   Reason not to administer Beta Blockers:Not Applicable. No home beta blockers 

## 2012-01-07 NOTE — Transfer of Care (Signed)
Immediate Anesthesia Transfer of Care Note  Patient: Barbara Ingram  Procedure(s) Performed: Procedure(s) (LRB): OPEN REDUCTION INTERNAL FIXATION (ORIF) WRIST FRACTURE (Left)  Patient Location: PACU  Anesthesia Type: General and GA combined with regional for post-op pain  Level of Consciousness: oriented, sedated, patient cooperative and responds to stimulation  Airway & Oxygen Therapy: Patient Spontanous Breathing and Patient connected to face mask oxygen  Post-op Assessment: Report given to PACU RN, Post -op Vital signs reviewed and stable, Patient moving all extremities and Patient moving all extremities X 4  Post vital signs: Reviewed and stable  Complications: No apparent anesthesia complications

## 2012-01-07 NOTE — ED Provider Notes (Signed)
Medical screening examination/treatment/procedure(s) were conducted as a shared visit with non-physician practitioner(s) and myself.  I personally evaluated the patient during the encounter  Linkin Vizzini, MD 01/07/12 1557 

## 2012-01-07 NOTE — Anesthesia Procedure Notes (Addendum)
Anesthesia Regional Block:  Supraclavicular block  Pre-Anesthetic Checklist: ,, timeout performed, Correct Patient, Correct Site, Correct Laterality, Correct Procedure, Correct Position, site marked, Risks and benefits discussed,  Surgical consent,  Pre-op evaluation,  At surgeon's request and post-op pain management  Laterality: Left  Prep: chloraprep       Needles:  Injection technique: Single-shot  Needle Type: Echogenic Stimulator Needle     Needle Length: 5cm 5 cm     Additional Needles:  Procedures: ultrasound guided and nerve stimulator Supraclavicular block  Nerve Stimulator or Paresthesia:  Response: 0.4 mA,   Additional Responses:   Narrative:  Start time: 01/07/2012 3:05 PM End time: 01/07/2012 3:20 PM Injection made incrementally with aspirations every 5 mL.  Performed by: Personally  Anesthesiologist: Arta Bruce MD  Additional Notes: Monitors applied. Patient sedated. Sterile prep and drape,hand hygiene and sterile gloves were used. Relevant anatomy identified.Needle position confirmed.Local anesthetic injected incrementally after negative aspiration. Local anesthetic spread visualized around nerve(s). Vascular puncture avoided. No complications. Image printed for medical record.The patient tolerated the procedure well.       Supraclavicular block Procedure Name: Intubation Date/Time: 01/07/2012 3:36 PM Performed by: Wray Kearns A Pre-anesthesia Checklist: Patient identified, Timeout performed, Emergency Drugs available, Suction available and Patient being monitored Patient Re-evaluated:Patient Re-evaluated prior to inductionOxygen Delivery Method: Circle system utilized Preoxygenation: Pre-oxygenation with 100% oxygen Intubation Type: IV induction, Rapid sequence and Cricoid Pressure applied Laryngoscope Size: Mac and 3 Grade View: Grade I Tube type: Oral Tube size: 7.5 mm Number of attempts: 1 Airway Equipment and Method: Stylet Placement  Confirmation: ETT inserted through vocal cords under direct vision,  breath sounds checked- equal and bilateral and positive ETCO2 Secured at: 21 cm Tube secured with: Tape Dental Injury: Teeth and Oropharynx as per pre-operative assessment

## 2012-01-07 NOTE — Anesthesia Preprocedure Evaluation (Addendum)
Anesthesia Evaluation  Patient identified by MRN, date of birth, ID band Patient awake    Reviewed: Allergy & Precautions, H&P , NPO status , Patient's Chart, lab work & pertinent test results, reviewed documented beta blocker date and time   Airway Mallampati: I TM Distance: >3 FB Neck ROM: Full    Dental  (+) Teeth Intact and Dental Advisory Given   Pulmonary    Pulmonary exam normal       Cardiovascular     Neuro/Psych PSYCHIATRIC DISORDERS Depression    GI/Hepatic   Endo/Other    Renal/GU      Musculoskeletal   Abdominal Normal abdominal exam  (+)   Peds  Hematology   Anesthesia Other Findings   Reproductive/Obstetrics                          Anesthesia Physical Anesthesia Plan  ASA: II and Emergent  Anesthesia Plan: General   Post-op Pain Management: MAC Combined w/ Regional for Post-op pain   Induction: Intravenous, Rapid sequence and Cricoid pressure planned  Airway Management Planned: Oral ETT  Additional Equipment:   Intra-op Plan:   Post-operative Plan: Extubation in OR  Informed Consent: I have reviewed the patients History and Physical, chart, labs and discussed the procedure including the risks, benefits and alternatives for the proposed anesthesia with the patient or authorized representative who has indicated his/her understanding and acceptance.   Dental advisory given  Plan Discussed with: CRNA and Surgeon  Anesthesia Plan Comments:        Anesthesia Quick Evaluation

## 2012-01-08 MED ORDER — DSS 100 MG PO CAPS: 100.0000 mg | ORAL_CAPSULE | Freq: Two times a day (BID) | ORAL | Status: AC

## 2012-01-08 MED ORDER — ASCORBIC ACID 1000 MG PO TABS: 1000.0000 mg | ORAL_TABLET | Freq: Every day | ORAL | Status: AC

## 2012-01-08 MED ORDER — OXYCODONE HCL 5 MG PO TABS: 5.0000 mg | ORAL_TABLET | ORAL | Status: AC | PRN

## 2012-01-08 NOTE — Progress Notes (Signed)
Physical Therapy Note  Orders received, chart reviewed, briefly spoke with Ms. Joos and Dr. Amanda Pea;   PT Screen performed;  Pt fully ambulatory and independent prior to arrival; Likely has no PT needs;  OT currently seeing pt,  Will sign off,  Thanks,  [place], [place] [phone]

## 2012-01-08 NOTE — Discharge Summary (Signed)
Physician Discharge Summary  Patient ID: Barbara Ingram MRN: [id number] DOB/AGE: [birth year] 46 y.o.  Admit date: 01/07/2012 Discharge date: 01/08/2012  Admission Diagnoses: Comminuted left wrist fracture/ right forearm mass  Discharge Diagnoses: Status post open reduction internal fixation left wrist fracture. Status post forearm mass removal. Active Problems:  * No active hospital problems. *    Discharged Condition: good  Hospital Course: Patient was admitted postoperatively status post reconstruction left wrist. She did well with her hospital stay she was awake alert and oriented tolerating a regular diet and voiding well. She understands her postop discharge instructions and plans for the future. She understands that we want her to keep the left upper extremity clean dry and to not get it wet or sweats underneath it. At the time of discharge she looks rather well her physical examination showed that she had excellent sensation motor function and I thoroughly went over all postop how driven issues including therapy followup etc. Consults: None    Treatments: surgery: Please see surgical note 01/07/2012  Discharge Exam: Blood pressure 100/54, pulse 80, temperature 98.5 F (36.9 C), temperature source Oral, resp. rate 16, last menstrual period 01/07/2012, SpO2 99.00%. General appearance: alert, cooperative and appears stated age she has no signs of infection dystrophy or vascular compromise. Her radial median and ulnar nerves are all intact to sensation and motor function. She has clear lung fields abdomen is nontender neck and back are nontender lower stem examination is benign. We have changed her bandage on the right forearm she understands dressing change routine etc. She is tolerating a regular diet voiding well and looks excellent at this juncture postop day 1. Disposition: Final discharge disposition not confirmed  Discharge Orders    Future Appointments: Provider: Department:  Dept Phone: Center:   03/07/2012 10:30 AM Windell Hummingbird [facility] [phone] None   03/07/2012 11:00 AM Victorino December, MD [facility] [phone] None     Medication List  As of 01/08/2012  9:55 AM   ASK your doctor about these medications         CO Q 10 PO   Take 1 tablet by mouth daily.      fish oil-omega-3 fatty acids 1000 MG capsule   Take 2 g by mouth daily.      multivitamin with minerals Tabs   Take 1 tablet by mouth daily.      VITAMIN C PO   Take 1 tablet by mouth daily.      VITAMIN D PO   Take 1 tablet by mouth daily.      ZINC PO   Take 1 tablet by mouth daily.           Follow-up Information    Follow up with Karen Chafe, MD. (Please call [phone]  Dr. Amanda Pea to see for followup in 10-14 days)    Contact information:   [address] [place] [address] [phone]          Signed: Karen Chafe 01/08/2012, 9:55 AM

## 2012-01-08 NOTE — Progress Notes (Signed)
Occupational Therapy Evaluation Patient Details Name: Barbara Ingram MRN: [id number] DOB: [birth year] Today's Date: 01/08/2012 Time: 9562-1308 OT Time Calculation (min): 17 min  OT Assessment / Plan / Recommendation Clinical Impression  Pt s/p ORIF due to left distal radial fx thus affecting PLOF.  Pt is independent with functional mobility.  Completed education on ADLs, elevation and edema control.  Pt with no further acute OT needs. All further OT needs can be addressed as recommended by MD at f/u appointment.     OT Assessment  All further OT needs can be met in the next venue of care (progress rehab as recommended by MD at f/u)    Follow Up Recommendations   (futur OPOT as recommended by MD)    Barriers to Discharge      Equipment Recommendations  None recommended by OT    Recommendations for Other Services    Frequency       Precautions / Restrictions     Pertinent Vitals/Pain See vitals     ADL  Upper Body Dressing: Simulated;Minimal assistance Where Assessed - Upper Body Dressing: Supine, head of bed up Lower Body Dressing: Performed;Modified independent Where Assessed - Lower Body Dressing: Supine, head of bed up Equipment Used:  (L UE sling) Transfers/Ambulation Related to ADLs: Not assessed during session.  Pt has been ambulating throughout room independently this AM though. ADL Comments: Pt donned LUE sling (MD recommended for comfort when ambulating) with min assist for proper positioning. Educated pt on UB dressing techniques (threading LUE first when donning clothes and last when doffing clothes).  Also recommended pt use left hand for light ADL tasks such as assisting in holding contact case or twisting toothpaste cap off bottle.  Recommend pt avoid holding heavy items in Left hand. Educated pt on elevating LUE using pillows and performing digit/elbow/shoulder ROM.    OT Diagnosis:    OT Problem List:   OT Treatment Interventions:     OT Goals    Visit  Information  Last OT Received On: 01/08/12    Subjective Data      Prior Functioning  Vision/Perception  Home Living Lives With: Family (2 teenage children) Available Help at Discharge: Available PRN/intermittently Prior Function Level of Independence: Independent Dominant Hand: Right      Cognition  Overall Cognitive Status: Appears within functional limits for tasks assessed/performed Arousal/Alertness: Awake/alert Orientation Level: Appears intact for tasks assessed Behavior During Session: [facility] for tasks performed    Extremity/Trunk Assessment Right Upper Extremity Assessment RUE ROM/Strength/Tone: Within functional levels Left Upper Extremity Assessment LUE ROM/Strength/Tone: Deficits;Due to precautions;Unable to fully assess LUE ROM/Strength/Tone Deficits: Wrist ROM limited due to precautions.  Pt with full ROM in digits, elbow, and shoulder. LUE Sensation: WFL - Light Touch LUE Coordination: WFL - gross/fine motor   Mobility Bed Mobility Bed Mobility: Not assessed Transfers Transfers: Not assessed   Exercise General Exercises - Upper Extremity Shoulder Flexion: AROM;Left;10 reps;Seated Shoulder Extension: AROM;Left;10 reps;Seated Elbow Flexion: AROM;Left;10 reps;Seated Elbow Extension: AROM;Left;10 reps;Seated Digit Composite Flexion: AROM;Left;10 reps;Seated Composite Extension: AROM;Left;10 reps;Seated  Balance    End of Session OT - End of Session Equipment Utilized During Treatment:  (L UE sling) Activity Tolerance: Patient tolerated treatment well Patient left: in bed;with call bell/phone within reach Nurse Communication: Mobility status  GO    01/08/2012 Cipriano Mile OTR/L Pager [phone] Office [phone]  Cipriano Mile 01/08/2012, 10:01 AM

## 2012-01-08 NOTE — Discharge Instructions (Signed)
You may change the bandage in her right forearm daily. Avoid Band-Aids. Keep the area clean and dry.  Call for any problems. We recommend a cast cover or protector which he can purchase for shower purposes etc.  ..We recommend that you to take vitamin C 1000 mg a day to promote healing we also recommend that if you require her pain medicine that he take a stool softener to prevent constipation as most pain medicines will have constipation side effects. We recommend either Peri-Colace or Senokot and recommend that you also consider adding MiraLAX to prevent the constipation affects from pain medicine if you are required to use them. These medicines are over the counter and maybe purchased at a local pharmacy.  Keep bandage clean and dry.  Call for any problems.  No smoking.  Criteria for driving a car: you should be off your pain medicine for 7-8 hours, able to drive one handed(confident), thinking clearly and feeling able in your judgement to drive. Continue elevation as it will decrease swelling.  If instructed by MD move your fingers within the confines of the bandage/splint.  Use ice if instructed by your MD. Call immediately for any sudden loss of feeling in your hand/arm or change in functional abilities of the extremity.

## 2012-01-08 NOTE — Progress Notes (Signed)
Subjective: 1 Day Post-Op Procedure(s) (LRB): OPEN REDUCTION INTERNAL FIXATION (ORIF) WRIST FRACTURE (Left) Patient reports pain as mild.    Objective: Vital signs in last 24 hours: Temp:  [98 F (36.7 C)-98.6 F (37 C)] 98.5 F (36.9 C) (08/25 0535) Pulse Rate:  [67-92] 80  (08/25 0535) Resp:  [12-22] 16  (08/25 0535) BP: (100-141)/(54-75) 100/54 mmHg (08/25 0535) SpO2:  [97 %-100 %] 99 % (08/25 0535)  Intake/Output from previous day: 08/24 0701 - 08/25 0700 In: 1800 [I.V.:1800] Out: 36 [Urine:6; Drains:30] Intake/Output this shift:     Basename 01/07/12 1858  HGB 12.5    Basename 01/07/12 1858  WBC 7.9  RBC 4.17  HCT 36.2  PLT 217   No results found for this basename: NA:2,K:2,CL:2,CO2:2,BUN:2,CREATININE:2,GLUCOSE:2,CALCIUM:2 in the last 72 hours No results found for this basename: LABPT:2,INR:2 in the last 72 hours  Neurologically intact ABD soft Neurovascular intact Sensation intact distally Intact pulses distally Dorsiflexion/Plantar flexion intact No cellulitis present Compartment soft  Assessment/Plan: 1 Day Post-Op Procedure(s) (LRB): OPEN REDUCTION INTERNAL FIXATION (ORIF) WRIST FRACTURE (Left) Plan for discharge home today please see discharge summary  Barbara Ingram,Barbara Ingram 01/08/2012, 10:01 AM

## 2012-01-08 NOTE — Progress Notes (Signed)
D/C to car via w/c in stable condition.Scripts given to husband.

## 2012-01-09 NOTE — OR Nursing (Signed)
Late entry by  Timoteo Expose, RN to add the drill bit supply charges.

## 2012-01-10 ENCOUNTER — Encounter (HOSPITAL_COMMUNITY): Payer: Self-pay | Admitting: Orthopedic Surgery

## 2012-01-24 ENCOUNTER — Other Ambulatory Visit: Payer: Self-pay | Admitting: Internal Medicine

## 2012-01-25 NOTE — Telephone Encounter (Signed)
Rx faxed to pharmacy  

## 2012-03-07 ENCOUNTER — Telehealth: Payer: Self-pay | Admitting: Oncology

## 2012-03-07 ENCOUNTER — Ambulatory Visit (HOSPITAL_BASED_OUTPATIENT_CLINIC_OR_DEPARTMENT_OTHER): Payer: 59 | Admitting: Oncology

## 2012-03-07 ENCOUNTER — Encounter: Payer: Self-pay | Admitting: Oncology

## 2012-03-07 ENCOUNTER — Other Ambulatory Visit: Payer: Self-pay | Admitting: *Deleted

## 2012-03-07 ENCOUNTER — Other Ambulatory Visit (HOSPITAL_BASED_OUTPATIENT_CLINIC_OR_DEPARTMENT_OTHER): Payer: 59 | Admitting: Lab

## 2012-03-07 VITALS — BP 119/73 | HR 70 | Temp 98.4°F | Resp 20 | Ht 71.0 in | Wt 139.4 lb

## 2012-03-07 DIAGNOSIS — Z853 Personal history of malignant neoplasm of breast: Secondary | ICD-10-CM

## 2012-03-07 DIAGNOSIS — C50919 Malignant neoplasm of unspecified site of unspecified female breast: Secondary | ICD-10-CM

## 2012-03-07 LAB — COMPREHENSIVE METABOLIC PANEL (CC13)
ALT: 21 U/L (ref 0–55)
AST: 20 U/L (ref 5–34)
Albumin: 3.8 g/dL (ref 3.5–5.0)
Alkaline Phosphatase: 58 U/L (ref 40–150)
BUN: 13 mg/dL (ref 7.0–26.0)
CO2: 22 mEq/L (ref 22–29)
Calcium: 9.6 mg/dL (ref 8.4–10.4)
Chloride: 106 mEq/L (ref 98–107)
Creatinine: 0.8 mg/dL (ref 0.6–1.1)
Glucose: 104 mg/dl — ABNORMAL HIGH (ref 70–99)
Potassium: 3.8 mEq/L (ref 3.5–5.1)
Sodium: 138 mEq/L (ref 136–145)
Total Bilirubin: 0.6 mg/dL (ref 0.20–1.20)
Total Protein: 6.8 g/dL (ref 6.4–8.3)

## 2012-03-07 LAB — CBC WITH DIFFERENTIAL/PLATELET
BASO%: 1 % (ref 0.0–2.0)
Basophils Absolute: 0.1 10*3/uL (ref 0.0–0.1)
EOS%: 2.4 % (ref 0.0–7.0)
Eosinophils Absolute: 0.1 10*3/uL (ref 0.0–0.5)
HCT: 38.6 % (ref 34.8–46.6)
HGB: 13.1 g/dL (ref 11.6–15.9)
LYMPH%: 24.7 % (ref 14.0–49.7)
MCH: 30.6 pg (ref 25.1–34.0)
MCHC: 33.9 g/dL (ref 31.5–36.0)
MCV: 90.3 fL (ref 79.5–101.0)
MONO#: 0.4 10*3/uL (ref 0.1–0.9)
MONO%: 7.5 % (ref 0.0–14.0)
NEUT#: 3.3 10*3/uL (ref 1.5–6.5)
NEUT%: 64.4 % (ref 38.4–76.8)
Platelets: 235 10*3/uL (ref 145–400)
RBC: 4.28 10*6/uL (ref 3.70–5.45)
RDW: 13.1 % (ref 11.2–14.5)
WBC: 5.1 10*3/uL (ref 3.9–10.3)
lymph#: 1.3 10*3/uL (ref 0.9–3.3)

## 2012-03-07 NOTE — Progress Notes (Signed)
OFFICE PROGRESS NOTE  CC  Rene Paci, MD [address] [facility] [address] [place] [address]  DIAGNOSIS: 46 year old female with history of premenopausal stage I right breast cancer diagnosed when she was 46 years old in January 2005.  PRIOR THERAPY:  #1 status post bilateral mastectomies with tissue expander reconstruction on Jerry 31 2005. 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 The final pathology stage was T1 B. N0 clinical stage I. The left breast had extensive adenosis and no atypia or malignancy. Postoperatively patient was recommended adjuvant treatment with antiestrogen therapy but she declined.  #2 patient has been on supplemental hormones. She originally was seen by Dr. Jackalyn Lombard for an OB/GYN in [facility]. He has tested her estrogen levels periodically. She does take DHEA a 2.5 mg daily and naltrexone 4.5 mg daily  CURRENT THERAPY: Observation  INTERVAL HISTORY: Barbara Ingram 46 y.o. female returns for followup visit. I have been seeing her on a yearly basis now. Overall she is doing well. She really is without any complaints she has no pain. No nausea no vomiting no evidence of recurrent disease. She does tell me that she and her husband have divorced. She has no fevers chills or night sweats no tenderness anywhere no abdominal pain no back pain. She continues to exercise regularly. Remainder of the 10 point review of systems is negative  MEDICAL HISTORY: Past Medical History  Diagnosis Date  . LIPOMAS, MULTIPLE   . THYROID STIMULATING HORMONE, ABNORMAL 2012  . ADENOCARCINOMA, BREAST dx 04/2003    lumpectomy, B mastectomy, no chemo  . ALLERGIC RHINITIS   . IBS (irritable bowel syndrome)   . Depression fall 2012    ALLERGIES:  is allergic to penicillins.  MEDICATIONS:  Current Outpatient  Prescriptions  Medication Sig Dispense Refill  . ALPRAZolam (XANAX) 0.25 MG tablet TAKE 1 TABLET BY MOUTH THREE TIMES DAILY AS NEEDED FOR ANXIETY  30 tablet  2  . Ascorbic Acid (VITAMIN C PO) Take 1 tablet by mouth daily.      . Cholecalciferol (VITAMIN D PO) Take 1 tablet by mouth daily.      . Coenzyme Q10 (CO Q 10 PO) Take 1 tablet by mouth daily.      . fish oil-omega-3 fatty acids 1000 MG capsule Take 2 g by mouth daily.      . Multiple Vitamin (MULTIVITAMIN WITH MINERALS) TABS Take 1 tablet by mouth daily.      . Multiple Vitamins-Minerals (ZINC PO) Take 1 tablet by mouth daily.      . vitamin C (VITAMIN C) 1000 MG tablet Take 1 tablet (1,000 mg total) by mouth daily.  120 tablet  0    SURGICAL HISTORY:  Past Surgical History  Procedure Date  . Breast biopsy 04/2003  . Breast lumpectomy 05/2003  . Mastectomy 05/2003    Bilateral  . Breast reconstruction   . Cesarean section   . Orif wrist fracture 01/07/2012    Procedure: OPEN REDUCTION INTERNAL FIXATION (ORIF) WRIST FRACTURE;  Surgeon: Dominica Severin, MD;  Location: [facility] OR;  Service: Orthopedics;  Laterality: Left;  excision lipoma right forearm    REVIEW OF SYSTEMS:  Pertinent items are noted in HPI.   HEALTH MAINTENANCE:  PHYSICAL EXAMINATION: Blood pressure 119/73, pulse 70, temperature 98.4 F (36.9 C), temperature source Oral, resp. rate 20, height 5\' 11"  (1.803 m), weight 139  lb 6.4 oz (63.231 kg). Body mass index is 19.44 kg/(m^2). ECOG PERFORMANCE STATUS: 0 - Asymptomatic Well-developed well-nourished female in no acute distress HEENT exam EOMI PERRLA sclerae anicteric no conjunctival pallor oral mucosa is moist no thrush or mucositis neck is supple no palpable cervical supraclavicular or axillary adenopathy lungs are clear bilaterally to auscultation cardiovascular is regular rate rhythm no murmurs abdomen is soft nontender nondistended bowel sounds are present no HSM extremities no edema neuro is nonfocal bilateral  reconstructive breasts well-healed no skin changes.   LABORATORY DATA: Lab Results  Component Value Date   WBC 5.1 03/07/2012   HGB 13.1 03/07/2012   HCT 38.6 03/07/2012   MCV 90.3 03/07/2012   PLT 235 03/07/2012      Chemistry      Component Value Date/Time   NA 138 03/07/2012 1121   NA 136 01/04/2011 1159   NA 136 01/04/2011 1159   K 3.8 03/07/2012 1121   K 4.0 01/04/2011 1159   K 4.0 01/04/2011 1159   CL 106 03/07/2012 1121   CL 103 01/04/2011 1159   CL 103 01/04/2011 1159   CO2 22 03/07/2012 1121   CO2 21 01/04/2011 1159   CO2 21 01/04/2011 1159   BUN 13.0 03/07/2012 1121   BUN 14 01/04/2011 1159   BUN 14 01/04/2011 1159   CREATININE 0.8 03/07/2012 1121   CREATININE 0.97 01/04/2011 1159   CREATININE 0.97 01/04/2011 1159      Component Value Date/Time   CALCIUM 9.6 03/07/2012 1121   CALCIUM 9.7 01/04/2011 1159   CALCIUM 9.7 01/04/2011 1159   ALKPHOS 58 03/07/2012 1121   ALKPHOS 36* 01/04/2011 1159   ALKPHOS 36* 01/04/2011 1159   AST 20 03/07/2012 1121   AST 21 01/04/2011 1159   AST 21 01/04/2011 1159   ALT 21 03/07/2012 1121   ALT 14 01/04/2011 1159   ALT 14 01/04/2011 1159   BILITOT 0.60 03/07/2012 1121   BILITOT 0.4 01/04/2011 1159   BILITOT 0.4 01/04/2011 1159       RADIOGRAPHIC STUDIES:  No results found.  ASSESSMENT: 46 year old female with  #1 premenopausal right breast cancer originally diagnosed in January 2005 patient was also genetically tested and she was found to be negative for the BRCA1 and 2 gene mutation. She underwent bilateral mastectomies with immediate reconstruction. She did not take any further adjuvant therapy. She has no clinical evidence of recurrent disease. Patient continues to be on hormone treatment with DHEA and naltrexone.   PLAN: I will continue to follow her on a yearly basis. She knows to call me with any problems   All questions were answered. The patient knows to call the clinic with any problems, questions or concerns. We can  certainly see the patient much sooner if necessary.  I spent 15 minutes counseling the patient face to face. The total time spent in the appointment was 25 minutes.    Drue Second, MD Medical/Oncology [facility] [phone] (beeper) [phone] (Office)  03/07/2012, 4:57 PM

## 2012-03-07 NOTE — Telephone Encounter (Signed)
lmonvm of the pt regarding her oct 2014 appt

## 2012-03-07 NOTE — Patient Instructions (Addendum)
Doing well, I will continue seeing you once a year.  Dr. Teodora Medici

## 2013-03-08 ENCOUNTER — Encounter (INDEPENDENT_AMBULATORY_CARE_PROVIDER_SITE_OTHER): Payer: Self-pay

## 2013-03-08 ENCOUNTER — Ambulatory Visit (HOSPITAL_BASED_OUTPATIENT_CLINIC_OR_DEPARTMENT_OTHER): Payer: 59 | Admitting: Oncology

## 2013-03-08 VITALS — BP 116/76 | HR 69 | Temp 97.7°F | Resp 18 | Ht 71.0 in | Wt 137.0 lb

## 2013-03-08 DIAGNOSIS — Z853 Personal history of malignant neoplasm of breast: Secondary | ICD-10-CM

## 2013-03-08 DIAGNOSIS — C50919 Malignant neoplasm of unspecified site of unspecified female breast: Secondary | ICD-10-CM

## 2013-03-08 MED ORDER — VENLAFAXINE HCL ER 37.5 MG PO CP24: 37.5000 mg | ORAL_CAPSULE | Freq: Every day | ORAL | Status: DC

## 2013-03-08 NOTE — Patient Instructions (Signed)
Venlafaxine extended-release capsules What is this medicine? VENLAFAXINE (VEN la fax een) is used to treat depression, anxiety and panic disorder. This medicine may be used for other purposes; ask your health care provider or pharmacist if you have questions. What should I tell my health care provider before I take this medicine? They need to know if you have any of these conditions: -anorexia or weight loss -glaucoma -high blood pressure, heart problems or a recent heart attack -high cholesterol levels or receiving treatment for high cholesterol -kidney or liver disease -mania or bipolar disorder -seizures (convulsions) -suicidal thoughts or a previous suicide attempt -thyroid problems -an unusual or allergic reaction to venlafaxine, other medicines, foods, dyes, or preservatives -pregnant or trying to get pregnant -breast-feeding How should I use this medicine? Take this medicine by mouth with a full glass of water. Follow the directions on the prescription label. Do not cut, crush or chew this medicine. Take it with food. Try to take your medicine at about the same time each day. Do not take your medicine more often than directed. Do not stop taking this medicine suddenly except upon the advice of your doctor. Stopping this medicine too quickly may cause serious side effects or your condition may worsen. A special MedGuide will be given to you by the pharmacist with each prescription and refill. Be sure to read this information carefully each time. Talk to your pediatrician regarding the use of this medicine in children. Special care may be needed. Overdosage: If you think you have taken too much of this medicine contact a poison control center or emergency room at once. NOTE: This medicine is only for you. Do not share this medicine with others. What if I miss a dose? If you miss a dose, take it as soon as you can. If it is almost time for your next dose, take only that dose. Do  not take double or extra doses. What may interact with this medicine? Do not take this medicine with any of the following medications: -desvenlafaxine -duloxetine -linezolid -MAOIs like Azilect, Carbex, Eldepryl, Marplan, Nardil, and Parnate -medicines for weight control or appetite -methylene blue (injected into a vein) -nefazodone -procarbazine -tryptophan This medicine may also interact with the following medications: -amphetamine or dextroamphetamine -aspirin and aspirin-like medicines -cimetidine -clozapine -medicines for heart rhythm or blood pressure -medicines for migraine headache like almotriptan, eletriptan, frovatriptan, naratriptan, rizatriptan, sumatriptan, zolmitriptan -medicines that treat or prevent blood clots like warfarin, enoxaparin, and dalteparin -NSAIDS, medicines for pain and inflammation, like ibuprofen or naproxen -other medicines for depression, anxiety, or psychotic disturbances -ritonavir -St. John's wort -tramadol This list may not describe all possible interactions. Give your health care provider a list of all the medicines, herbs, non-prescription drugs, or dietary supplements you use. Also tell them if you smoke, drink alcohol, or use illegal drugs. Some items may interact with your medicine. What should I watch for while using this medicine? Tell your doctor if your symptoms do not get better or if they get worse. Visit your doctor or health care professional for regular checks on your progress. Because it may take several weeks to see the full effects of this medicine, it is important to continue your treatment as prescribed by your doctor. Patients and their families should watch out for new or worsening thoughts of suicide or depression. Also watch out for sudden changes in feelings such as feeling anxious, agitated, panicky, irritable, hostile, aggressive, impulsive, severely restless, overly excited and hyperactive, or not being  able to sleep. If  this happens, especially at the beginning of treatment or after a change in dose, call your health care professional. This medicine can cause an increase in blood pressure. Check with your doctor for instructions on monitoring your blood pressure while taking this medicine. You may get drowsy or dizzy. Do not drive, use machinery, or do anything that needs mental alertness until you know how this medicine affects you. Do not stand or sit up quickly, especially if you are an older patient. This reduces the risk of dizzy or fainting spells. Alcohol may interfere with the effect of this medicine. Avoid alcoholic drinks. Your mouth may get dry. Chewing sugarless gum, sucking hard candy and drinking plenty of water will help. Contact your doctor if the problem does not go away or is severe. What side effects may I notice from receiving this medicine? Side effects that you should report to your doctor or health care professional as soon as possible: -allergic reactions like skin rash, itching or hives, swelling of the face, lips, or tongue -breathing problems -changes in vision -hallucination, loss of contact with reality -seizures -suicidal thoughts or other mood changes -trouble passing urine or change in the amount of urine -unusual bleeding or bruising Side effects that usually do not require medical attention (report to your doctor or health care professional if they continue or are bothersome): -change in sex drive or performance -constipation -increased sweating -loss of appetite -nausea -tremors -weight loss This list may not describe all possible side effects. Call your doctor for medical advice about side effects. You may report side effects to FDA at 1-800-FDA-1088. Where should I keep my medicine? Keep out of the reach of children. Store at a controlled temperature between 20 and 25 degrees C (68 degrees and 77 degrees F), in a dry place. Throw away any unused medicine after the  expiration date. NOTE: This sheet is a summary. It may not cover all possible information. If you have questions about this medicine, talk to your doctor, pharmacist, or health care provider.  2013, [employer]. (09/16/2011 9:05:04 PM)

## 2013-03-10 NOTE — Progress Notes (Signed)
OFFICE PROGRESS NOTE  CC  Rene Paci, MD [address] [facility] [address] [place] [address]  DIAGNOSIS: 47 year old female with history of premenopausal stage I right breast cancer diagnosed when she was 47 years old in January 2005.  PRIOR THERAPY:  #1 status post bilateral mastectomies with tissue expander reconstruction on Jerry 31 2005. 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 The final pathology stage was T1 B. N0 clinical stage I. The left breast had extensive adenosis and no atypia or malignancy. Postoperatively patient was recommended adjuvant treatment with antiestrogen therapy but she declined.  #2 patient has been on supplemental hormones. She originally was seen by Dr. Jackalyn Lombard for an OB/GYN in [facility]. He has tested her estrogen levels periodically. She does take DHEA a 2.5 mg daily and naltrexone 4.5 mg daily  CURRENT THERAPY: Observation  INTERVAL HISTORY: Barbara Ingram 47 y.o. female returns for followup visit. I have been seeing her on a yearly basis now. Overall she is doing well. She really is without any complaints she has no pain. No nausea no vomiting no evidence of recurrent disease. She does tell me that she and her husband have divorced.she has undergone a lot of stressors currently she was very tearful today. This is understandable do to her situation. She has no fevers chills or night sweats no tenderness anywhere no abdominal pain no back pain. She continues to exercise regularly. Remainder of the 10 point review of systems is negative  MEDICAL HISTORY: Past Medical History  Diagnosis Date  . LIPOMAS, MULTIPLE   . THYROID STIMULATING HORMONE, ABNORMAL 2012  . ADENOCARCINOMA, BREAST dx 04/2003    lumpectomy, B mastectomy, no chemo  . ALLERGIC RHINITIS   . IBS (irritable bowel syndrome)    . Depression fall 2012    ALLERGIES:  is allergic to keflex and penicillins.  MEDICATIONS:  Current Outpatient Prescriptions  Medication Sig Dispense Refill  . ALPRAZolam (XANAX) 0.25 MG tablet TAKE 1 TABLET BY MOUTH THREE TIMES DAILY AS NEEDED FOR ANXIETY  30 tablet  2  . Ascorbic Acid (VITAMIN C PO) Take 1 tablet by mouth daily.      . Cholecalciferol (VITAMIN D PO) Take 1 tablet by mouth daily.      . Coenzyme Q10 (CO Q 10 PO) Take 1 tablet by mouth daily.      . fish oil-omega-3 fatty acids 1000 MG capsule Take 2 g by mouth daily.      . Multiple Vitamin (MULTIVITAMIN WITH MINERALS) TABS Take 1 tablet by mouth daily.      . Multiple Vitamins-Minerals (ZINC PO) Take 1 tablet by mouth daily.      [name] venlafaxine XR (EFFEXOR-XR) 37.5 MG 24 hr capsule Take 1 capsule (37.5 mg total) by mouth daily.  30 capsule  6   No current facility-administered medications for this visit.    SURGICAL HISTORY:  Past Surgical History  Procedure Laterality Date  . Breast biopsy  04/2003  . Breast lumpectomy  05/2003  . Mastectomy  05/2003    Bilateral  . Breast reconstruction    . Cesarean section    . Orif wrist fracture  01/07/2012    Procedure: OPEN REDUCTION INTERNAL FIXATION (ORIF) WRIST FRACTURE;  Surgeon: Dominica Severin, MD;  Location: [facility] OR;  Service: Orthopedics;  Laterality: Left;  excision lipoma right forearm    REVIEW OF  SYSTEMS:  Pertinent items are noted in HPI.   HEALTH MAINTENANCE:  PHYSICAL EXAMINATION: Blood pressure 116/76, pulse 69, temperature 97.7 F (36.5 C), temperature source Oral, resp. rate 18, height 5\' 11"  (1.803 m), weight 137 lb (62.143 kg). Body mass index is 19.12 kg/(m^2). ECOG PERFORMANCE STATUS: 0 - Asymptomatic Well-developed well-nourished female in no acute distress HEENT exam EOMI PERRLA sclerae anicteric no conjunctival pallor oral mucosa is moist no thrush or mucositis neck is supple no palpable cervical supraclavicular or axillary adenopathy lungs are  clear bilaterally to auscultation cardiovascular is regular rate rhythm no murmurs abdomen is soft nontender nondistended bowel sounds are present no HSM extremities no edema neuro is nonfocal bilateral reconstructive breasts well-healed no skin changes.   LABORATORY DATA: Lab Results  Component Value Date   WBC 5.1 03/07/2012   HGB 13.1 03/07/2012   HCT 38.6 03/07/2012   MCV 90.3 03/07/2012   PLT 235 03/07/2012      Chemistry      Component Value Date/Time   NA 138 03/07/2012 1121   NA 136 01/04/2011 1159   K 3.8 03/07/2012 1121   K 4.0 01/04/2011 1159   CL 106 03/07/2012 1121   CL 103 01/04/2011 1159   CO2 22 03/07/2012 1121   CO2 21 01/04/2011 1159   BUN 13.0 03/07/2012 1121   BUN 14 01/04/2011 1159   CREATININE 0.8 03/07/2012 1121   CREATININE 0.97 01/04/2011 1159      Component Value Date/Time   CALCIUM 9.6 03/07/2012 1121   CALCIUM 9.7 01/04/2011 1159   ALKPHOS 58 03/07/2012 1121   ALKPHOS 36* 01/04/2011 1159   AST 20 03/07/2012 1121   AST 21 01/04/2011 1159   ALT 21 03/07/2012 1121   ALT 14 01/04/2011 1159   BILITOT 0.60 03/07/2012 1121   BILITOT 0.4 01/04/2011 1159       RADIOGRAPHIC STUDIES:  No results found.  ASSESSMENT: 47 year old female with  #1 premenopausal right breast cancer originally diagnosed in January 2005 patient was also genetically tested and she was found to be negative for the BRCA1 and 2 gene mutation. She underwent bilateral mastectomies with immediate reconstruction. She did not take any further adjuvant therapy. She has no clinical evidence of recurrent disease. Patient continues to be on hormone treatment with DHEA and naltrexone.   PLAN: I will continue to follow her on a yearly basis. She knows to call me with any problems. Patient and I did discuss the possibility of putting her on some sort of a mood stabilizers such as Zoloft or Effexor. I do think this will benefit her greatly. I therefore did give her a prescription for Effexor 37.5 mg  to be taken daily. She will let me know how she is doing with it.   All questions were answered. The patient knows to call the clinic with any problems, questions or concerns. We can certainly see the patient much sooner if necessary.  I spent 15 minutes counseling the patient face to face. The total time spent in the appointment was 25 minutes.    Drue Second, MD Medical/Oncology [facility] [phone] (beeper) [phone] (Office)  03/10/2013, 1:13 PM

## 2013-03-25 ENCOUNTER — Telehealth: Payer: Self-pay | Admitting: *Deleted

## 2013-03-25 NOTE — Telephone Encounter (Signed)
Left vm for pt to return call to discuss plastic surgeon referral for nipple tattooing.

## 2013-04-09 DIAGNOSIS — Z853 Personal history of malignant neoplasm of breast: Secondary | ICD-10-CM | POA: Insufficient documentation

## 2013-06-05 ENCOUNTER — Other Ambulatory Visit: Payer: Self-pay | Admitting: Emergency Medicine

## 2013-06-05 MED ORDER — NALTREXONE POWD: 4.5000 mg | Freq: Every day | Status: AC

## 2013-07-19 ENCOUNTER — Other Ambulatory Visit: Payer: Self-pay | Admitting: Obstetrics and Gynecology

## 2013-07-19 DIAGNOSIS — M8580 Other specified disorders of bone density and structure, unspecified site: Secondary | ICD-10-CM

## 2013-08-02 ENCOUNTER — Ambulatory Visit
Admission: RE | Admit: 2013-08-02 | Discharge: 2013-08-02 | Disposition: A | Discharge status: Home / Self Care | Payer: 59 | Source: Ambulatory Visit | Attending: Obstetrics and Gynecology | Admitting: Obstetrics and Gynecology

## 2013-08-02 ENCOUNTER — Telehealth: Payer: Self-pay

## 2013-08-02 DIAGNOSIS — M25569 Pain in unspecified knee: Secondary | ICD-10-CM

## 2013-08-02 DIAGNOSIS — M8580 Other specified disorders of bone density and structure, unspecified site: Secondary | ICD-10-CM

## 2013-08-02 NOTE — Telephone Encounter (Signed)
Patient called c/o knee pain/injury from injury and would like to request referral for PT. Patient requesting at least one visit. Thanks

## 2013-08-05 NOTE — Addendum Note (Signed)
Addended by: Gwendolyn Grant A on: 08/05/2013 08:24 AM   Modules accepted: Orders

## 2013-08-05 NOTE — Telephone Encounter (Signed)
PT refer - done

## 2014-10-23 ENCOUNTER — Other Ambulatory Visit: Payer: Self-pay | Admitting: Nurse Practitioner

## 2014-10-27 ENCOUNTER — Ambulatory Visit
Admission: RE | Admit: 2014-10-27 | Discharge: 2014-10-27 | Disposition: A | Discharge status: Home / Self Care | Payer: 59 | Source: Ambulatory Visit | Attending: Nurse Practitioner | Admitting: Nurse Practitioner

## 2015-01-21 ENCOUNTER — Encounter: Payer: Self-pay | Admitting: Obstetrics and Gynecology

## 2015-01-21 ENCOUNTER — Ambulatory Visit (INDEPENDENT_AMBULATORY_CARE_PROVIDER_SITE_OTHER): Payer: 59 | Admitting: Obstetrics and Gynecology

## 2015-01-21 VITALS — BP 122/70 | HR 90 | Resp 16 | Ht 69.0 in | Wt 135.0 lb

## 2015-01-21 DIAGNOSIS — Z0000 Encounter for general adult medical examination without abnormal findings: Secondary | ICD-10-CM

## 2015-01-21 DIAGNOSIS — Z113 Encounter for screening for infections with a predominantly sexual mode of transmission: Secondary | ICD-10-CM | POA: Diagnosis not present

## 2015-01-21 DIAGNOSIS — Z01419 Encounter for gynecological examination (general) (routine) without abnormal findings: Secondary | ICD-10-CM | POA: Diagnosis not present

## 2015-01-21 LAB — POCT URINALYSIS DIPSTICK
Bilirubin, UA: NEGATIVE
Blood, UA: NEGATIVE
Glucose, UA: NEGATIVE
Ketones, UA: NEGATIVE
Leukocytes, UA: NEGATIVE
Nitrite, UA: NEGATIVE
Protein, UA: NEGATIVE
Urobilinogen, UA: NEGATIVE
pH, UA: 5

## 2015-01-21 MED ORDER — ALPRAZOLAM 0.25 MG PO TABS: 0.2500 mg | ORAL_TABLET | Freq: Three times a day (TID) | ORAL | Status: DC | PRN

## 2015-01-21 NOTE — Progress Notes (Signed)
Patient ID: Barbara Ingram, female   DOB: 1965-06-03, 49 y.o.   MRN: [id number] 49 y.o. No obstetric history on file.Divroced. Caucasian female here for annual exam.    Status post bilateral mastectomy due to breast cancer.  Had reconstruction.   Wants STD testing.  Menses every 2.5 - 4 weeks.  Occurs every 2.5 weeks 3 - 4 times per year.  Mense are short and easy.   Relationship just ended.   Wants Rx for Xanax until she can see Dr. Asa Lente. Divorced.  Seeing a counselor for 3 years.  Took antidepressant for 6 weeks.   Just dx with hyperthyroidism.  Seeing Sanda Klein with [facility].  Just had a thyroid ultrasound.   Would like to consider labioplasty.  PCP:   Gwendolyn Grant, MD  Patient's last menstrual period was 01/15/2015 (approximate).          Sexually active: Yes.  female  The current method of family planning is copper IUD.   Placed in 2016.  Exercising: Yes.    Yoga, and weights. Smoker:  no  Health Maintenance: Pap:  1.5 to 2years ago per patient.  She thinks pap normal but Pos. HR HPV--no colposcopy and no treatment. History of abnormal Pap:  Yes, 1.5 to 2 years ago and patient thinks it was normal but Pos. HR HPV.  No treatment. MMG:  Bilaterally mastectomy due to Rt. Sided breast cancer in 2005 in [place]. Colonoscopy:  n/a BMD:   07/2013  Result  Normal:[facility](pt. States she had DEXA done 2005 prior to treatment for Breast CA and was told she was Osteopenic. TDaP:  Unsure Screening Labs:  Hb today: 12.4, Urine today: Neg   reports that she has never smoked. She has never used smokeless tobacco. She reports that she drinks alcohol. She reports that she does not use illicit drugs.  Past Medical History  Diagnosis Date  . LIPOMAS, MULTIPLE   . ADENOCARCINOMA, BREAST dx 04/2003    lumpectomy, B mastectomy, no chemo  . ALLERGIC RHINITIS   . IBS (irritable bowel syndrome)   . Depression fall 2012  . THYROID STIMULATING HORMONE, ABNORMAL 2012     hyperthyroidism    Past Surgical History  Procedure Laterality Date  . Breast biopsy  04/2003  . Breast lumpectomy  05/2003  . Mastectomy  05/2003    Bilateral  . Breast reconstruction    . Orif wrist fracture  01/07/2012    Procedure: OPEN REDUCTION INTERNAL FIXATION (ORIF) WRIST FRACTURE;  Surgeon: Roseanne Kaufman, MD;  Location: [facility];  Service: Orthopedics;  Laterality: Left;  excision lipoma right forearm  . Cesarean section  2003    Current Outpatient Prescriptions  Medication Sig Dispense Refill  . Cholecalciferol (VITAMIN D PO) Take 1 tablet by mouth daily.    . Coenzyme Q10 (CO Q 10 PO) Take 1 tablet by mouth daily.    . fish oil-omega-3 fatty acids 1000 MG capsule Take 2 g by mouth daily.    . Multiple Vitamin (MULTIVITAMIN WITH MINERALS) TABS Take 1 tablet by mouth daily.    . Multiple Vitamins-Minerals (ZINC PO) Take 1 tablet by mouth daily.    . Naltrexone POWD 4.5 mg by Does not apply route at bedtime. 30 Bottle 6   No current facility-administered medications for this visit.    Family History  Problem Relation Age of Onset  . Chronic bronchitis Mother   . Hypertension Father   . Hyperlipidemia Father   . Kidney disease Father   .  Heart disease Father     ROS:  Pertinent items are noted in HPI.  Otherwise, a comprehensive ROS was negative.  Exam:   BP 122/70 mmHg  Pulse 90  Resp 16  Ht 5\' 9"  (1.753 m)  Wt 135 lb (61.236 kg)  BMI 19.93 kg/m2  LMP 01/15/2015 (Approximate)    General appearance: alert, cooperative and appears stated age Head: Normocephalic, without obvious abnormality, atraumatic Neck: no adenopathy, supple, symmetrical, trachea midline and thyroid normal to inspection and palpation Lungs: clear to auscultation bilaterally Breasts:  Absent breasts and reconstruction present.  Palpable implants and scar tissue.  No axillary nodes.  Heart: regular rate and rhythm Abdomen: soft, non-tender; bowel sounds normal; no masses,  no  organomegaly Extremities: extremities normal, atraumatic, no cyanosis or edema Skin: Skin color, texture, turgor normal. No rashes or lesions Lymph nodes: Cervical, supraclavicular, and axillary nodes normal. No abnormal inguinal nodes palpated Neurologic: Grossly normal  Pelvic: External genitalia:  no lesions              Urethra:  normal appearing urethra with no masses, tenderness or lesions              Bartholins and Skenes: normal                 Vagina: normal appearing vagina with normal color and discharge, no lesions              Cervix: no lesions and IUD strings seen.               Pap taken: Yes.   Bimanual Exam:  Uterus:  normal size, contour, position, consistency, mobility, non-tender              Adnexa: normal adnexa and no mass, fullness, tenderness              Rectovaginal: Yes.  .  Confirms.              Anus:  normal sphincter tone, no lesions  Chaperone was present for exam.  Assessment:   Well woman visit with normal exam. Anxiety. Hyperthyroidism.  Some irregular cycles.   ParaGard IUD patient.  Prior Pap with positive HR HPV.  Desire for STD testing.  Status post bilateral mastectomy for breast cancer.  Interest in labioplasty.   Plan:  Recommended chest wall and axillary exams.  Pap and HR HPV as above. Recommendations for Calcium, Vitamin D, regular exercise program including cardiovascular and weight bearing exercise. Labs performed.  Yes.  .   See orders.  STD testing.  Refills given on medications.  Yes.  .  See orders.  Xanax 0.25 mg po tid prn.  #30, RF none.  Will get refills from her PCP.  Return for visit to discuss labioplasty possibility.   Follow up annually and prn.     After visit summary provided.

## 2015-01-21 NOTE — Patient Instructions (Signed)
EXERCISE AND DIET:  We recommended that you start or continue a regular exercise program for good health. Regular exercise means any activity that makes your heart beat faster and makes you sweat.  We recommend exercising at least 30 minutes per day at least 3 days a week, preferably 4 or 5.  We also recommend a diet low in fat and sugar.  Inactivity, poor dietary choices and obesity can cause diabetes, heart attack, stroke, and kidney damage, among others.    ALCOHOL AND SMOKING:  Women should limit their alcohol intake to no more than 7 drinks/beers/glasses of wine (combined, not each!) per week. Moderation of alcohol intake to this level decreases your risk of breast cancer and liver damage. And of course, no recreational drugs are part of a healthy lifestyle.  And absolutely no smoking or even second hand smoke. Most people know smoking can cause heart and lung diseases, but did you know it also contributes to weakening of your bones? Aging of your skin?  Yellowing of your teeth and nails?  CALCIUM AND VITAMIN D:  Adequate intake of calcium and Vitamin D are recommended.  The recommendations for exact amounts of these supplements seem to change often, but generally speaking 600 mg of calcium (either carbonate or citrate) and 800 units of Vitamin D per day seems prudent. Certain women may benefit from higher intake of Vitamin D.  If you are among these women, your doctor will have told you during your visit.    PAP SMEARS:  Pap smears, to check for cervical cancer or precancers,  have traditionally been done yearly, although recent scientific advances have shown that most women can have pap smears less often.  However, every woman still should have a physical exam from her gynecologist every year. It will include a breast check, inspection of the vulva and vagina to check for abnormal growths or skin changes, a visual exam of the cervix, and then an exam to evaluate the size and shape of the uterus and  ovaries.  And after 49 years of age, a rectal exam is indicated to check for rectal cancers. We will also provide age appropriate advice regarding health maintenance, like when you should have certain vaccines, screening for sexually transmitted diseases, bone density testing, colonoscopy, mammograms, etc.   MAMMOGRAMS:  All women over 40 years old should have a yearly mammogram. Many facilities now offer a "3D" mammogram, which may cost around $50 extra out of pocket. If possible,  we recommend you accept the option to have the 3D mammogram performed.  It both reduces the number of women who will be called back for extra views which then turn out to be normal, and it is better than the routine mammogram at detecting truly abnormal areas.    COLONOSCOPY:  Colonoscopy to screen for colon cancer is recommended for all women at age 50.  We know, you hate the idea of the prep.  We agree, BUT, having colon cancer and not knowing it is worse!!  Colon cancer so often starts as a polyp that can be seen and removed at colonscopy, which can quite literally save your life!  And if your first colonoscopy is normal and you have no family history of colon cancer, most women don't have to have it again for 10 years.  Once every ten years, you can do something that may end up saving your life, right?  We will be happy to help you get it scheduled when you are ready.    Be sure to check your insurance coverage so you understand how much it will cost.  It may be covered as a preventative service at no cost, but you should check your particular policy.     Buspirone tablets What is this medicine? BUSPIRONE (byoo SPYE rone) is used to treat anxiety disorders. This medicine may be used for other purposes; ask your health care provider or pharmacist if you have questions. COMMON BRAND NAME(S): BuSpar What should I tell my health care provider before I take this medicine? They need to know if you have any of these  conditions: -kidney or liver disease -an unusual or allergic reaction to buspirone, other medicines, foods, dyes, or preservatives -pregnant or trying to get pregnant -breast-feeding How should I use this medicine? Take this medicine by mouth with a glass of water. Follow the directions on the prescription label. You may take this medicine with or without food. To ensure that this medicine always works the same way for you, you should take it either always with or always without food. Take your doses at regular intervals. Do not take your medicine more often than directed. Do not stop taking except on the advice of your doctor or health care professional. Talk to your pediatrician regarding the use of this medicine in children. Special care may be needed. Overdosage: If you think you have taken too much of this medicine contact a poison control center or emergency room at once. NOTE: This medicine is only for you. Do not share this medicine with others. What if I miss a dose? If you miss a dose, take it as soon as you can. If it is almost time for your next dose, take only that dose. Do not take double or extra doses. What may interact with this medicine? Do not take this medicine with any of the following medications: -linezolid -MAOIs like Carbex, Eldepryl, Marplan, Nardil, and Parnate -methylene blue -procarbazine This medicine may also interact with the following medications: -diazepam -digoxin -diltiazem -erythromycin -grapefruit juice -haloperidol -medicines for mental depression or mood problems -medicines for seizures like carbamazepine, phenobarbital and phenytoin -nefazodone -other medications for anxiety -rifampin -ritonavir -some antifungal medicines like itraconazole, ketoconazole, and voriconazole -verapamil -warfarin This list may not describe all possible interactions. Give your health care provider a list of all the medicines, herbs, non-prescription drugs, or  dietary supplements you use. Also tell them if you smoke, drink alcohol, or use illegal drugs. Some items may interact with your medicine. What should I watch for while using this medicine? Visit your doctor or health care professional for regular checks on your progress. It may take 1 to 2 weeks before your anxiety gets better. You may get drowsy or dizzy. Do not drive, use machinery, or do anything that needs mental alertness until you know how this drug affects you. Do not stand or sit up quickly, especially if you are an older patient. This reduces the risk of dizzy or fainting spells. Alcohol can make you more drowsy and dizzy. Avoid alcoholic drinks. What side effects may I notice from receiving this medicine? Side effects that you should report to your doctor or health care professional as soon as possible: -blurred vision or other vision changes -chest pain -confusion -difficulty breathing -feelings of hostility or anger -muscle aches and pains -numbness or tingling in hands or feet -ringing in the ears -skin rash and itching -vomiting -weakness Side effects that usually do not require medical attention (report to your doctor or health care  professional if they continue or are bothersome): -disturbed dreams, nightmares -headache -nausea -restlessness or nervousness -sore throat and nasal congestion -stomach upset This list may not describe all possible side effects. Call your doctor for medical advice about side effects. You may report side effects to FDA at 1-800-FDA-1088. Where should I keep my medicine? Keep out of the reach of children. Store at room temperature below 30 degrees C (86 degrees F). Protect from light. Keep container tightly closed. Throw away any unused medicine after the expiration date. NOTE: This sheet is a summary. It may not cover all possible information. If you have questions about this medicine, talk to your doctor, pharmacist, or health care  provider.  2015, Elsevier/Gold Standard. (2009-12-10 18:06:11)

## 2015-01-22 LAB — HEPATITIS C ANTIBODY: HCV Ab: NEGATIVE

## 2015-01-22 LAB — STD PANEL
HIV 1&2 Ab, 4th Generation: NONREACTIVE
Hepatitis B Surface Ag: NEGATIVE

## 2015-01-23 LAB — IPS N GONORRHOEA AND CHLAMYDIA BY PCR

## 2015-01-23 LAB — IPS PAP TEST WITH HPV

## 2015-01-26 LAB — HEMOGLOBIN, FINGERSTICK: Hemoglobin, fingerstick: 12.4 g/dL (ref 12.0–16.0)

## 2015-02-02 ENCOUNTER — Other Ambulatory Visit: Payer: Self-pay | Admitting: Obstetrics and Gynecology

## 2015-02-02 ENCOUNTER — Telehealth: Payer: Self-pay | Admitting: Obstetrics and Gynecology

## 2015-02-02 DIAGNOSIS — N926 Irregular menstruation, unspecified: Secondary | ICD-10-CM

## 2015-02-02 NOTE — Telephone Encounter (Signed)
I recommend pelvic ultrasound and possible endometrial biopsy. I will place orders. I am happy to see the patient back.

## 2015-02-02 NOTE — Telephone Encounter (Signed)
Patient calling requesting to speak with the nurse about irregular bleeding and her cycle. No paper chart.

## 2015-02-02 NOTE — Telephone Encounter (Signed)
Call to patient. She states that Dr. Quincy Simmonds advised her to call if she continued to have irregular cycles with Paragard IUD. Patient having a cycle q 2 weeks and would like evaluation. Patient states cycles are not heavy and not painful.  Patient would like to move forward with next step for evaluation. Patient thinks Dr. Quincy Simmonds may have wanted to plan an ultrasound. No heavy bleeding at this time. LMP 02/01/15 and prior was 01/17/15.  Patient would like to discuss moving forward with consult for cosmetic procedure as well.   Advised patient would send message to Dr. Quincy Simmonds and return call with response and plan. Patient agreeable.

## 2015-02-03 NOTE — Addendum Note (Signed)
Addended by: Michele Mcalpine on: 02/03/2015 11:23 AM   Modules accepted: Orders

## 2015-02-03 NOTE — Telephone Encounter (Signed)
Spoke with patient. Reviewed benefit for procedure. Patient understood and agreeable. Scheduled for 02/05/15 per staff message request from Dr Quincy Simmonds. Patient is agreeable. Reviewed 72 hour cancellation policy with $747 fee. Patient understands and agreeable. Verified arrival date/time for procedure. Patient agreeable. Ok to close.

## 2015-02-05 ENCOUNTER — Encounter: Payer: Self-pay | Admitting: Obstetrics and Gynecology

## 2015-02-05 ENCOUNTER — Ambulatory Visit (INDEPENDENT_AMBULATORY_CARE_PROVIDER_SITE_OTHER): Payer: 59

## 2015-02-05 ENCOUNTER — Ambulatory Visit (INDEPENDENT_AMBULATORY_CARE_PROVIDER_SITE_OTHER): Payer: 59 | Admitting: Obstetrics and Gynecology

## 2015-02-05 VITALS — BP 98/60 | HR 80 | Ht 69.0 in | Wt 132.0 lb

## 2015-02-05 DIAGNOSIS — N921 Excessive and frequent menstruation with irregular cycle: Secondary | ICD-10-CM | POA: Diagnosis not present

## 2015-02-05 DIAGNOSIS — N926 Irregular menstruation, unspecified: Secondary | ICD-10-CM | POA: Diagnosis not present

## 2015-02-05 DIAGNOSIS — R946 Abnormal results of thyroid function studies: Secondary | ICD-10-CM

## 2015-02-05 NOTE — Patient Instructions (Signed)
Endometrial Biopsy, Care After Refer to this sheet in the next few weeks. These instructions provide you with information on caring for yourself after your procedure. Your health care provider may also give you more specific instructions. Your treatment has been planned according to current medical practices, but problems sometimes occur. Call your health care provider if you have any problems or questions after your procedure. WHAT TO EXPECT AFTER THE PROCEDURE After your procedure, it is typical to have the following:  You may have mild cramping and a small amount of vaginal bleeding for a few days after the procedure. This is normal. HOME CARE INSTRUCTIONS  Only take over-the-counter or prescription medicine as directed by your health care provider.  Do not douche, use tampons, or have sexual intercourse until your health care provider approves.  Follow your health care provider's instructions regarding any activity restrictions, such as strenuous exercise or heavy lifting. SEEK MEDICAL CARE IF:  You have heavy bleeding or bleeding longer than 2 days after the procedure.  You have bad smelling drainage from your vagina.  You have a fever and chills.  Youhave severe lower stomach (abdominal) pain. SEEK IMMEDIATE MEDICAL CARE IF:  You have severe cramps in your stomach or back.  You pass large blood clots.  Your bleeding increases.  You become weak or lightheaded, or you pass out. Document Released: 02/20/2013 Document Reviewed: 02/20/2013 ExitCare Patient Information 2015 [employer]. This information is not intended to replace advice given to you by your health care provider. Make sure you discuss any questions you have with your health care provider.  

## 2015-02-05 NOTE — Progress Notes (Signed)
Subjective  49 y.o. [id number] Caucasiain female here for pelvic ultrasound for irregular bleeding.   Menses every 2.5 - 4 weeks.  Occurs every 2.5 weeks 3 - 4 times per year for the last year. Mense are short and easy.  No real pain.  Has ParaGard IUD.  Interested in [place].  Saw a plastic surgeon about labial reduction and was counseled regarding reduction which would involve the periclitoral region and adipose transplant to the labia.  Status post mastectomy for breast cancer.  Negative for BRCA mutation.   Has hyperthyroidism and seeing a provider at Dr. Laureen Abrahams office.  Objective  Pelvic ultrasound images and report reviewed with patient.  Uterus - no masses.  IUD in place.  EMS - 4.18 mm. Ovaries - right ovarian cyst 28 x 20 mm with internal echoes - endometrioma vs. Hemorrhagic cyst.  Follicle also present 14 x 17 mm.   Left ovary normal.  Free fluid - no        Pelvic exam - normal external genitalia and urethra. Labia minora and majora normal.  Slight asymmetry between the right and left labia minora, but minimal. Cervix IUD strings seen.  Uterus small and nontender.  No adnexal masses or tenderness.  Procedure - endometrial biopsy Consent performed. Speculum place in vagina.  Sterile prep of cervix with Hibiclens.  IUD stings visible. Tenaculum to anterior cervical lip. Paracervical block with 10 cc 1% lidocaine _______________ yes.  Lot [id number], expiration 05/17/15. Pipelle placed to     6     cm without difficulty twice. Tissue obtained and sent to pathology. Speculum removed.  No complications. Minimal EBL. Pelvic exam repeated - uterus small and nontender.  IUD strings are visible and present.  Assessment   Irregular menses.  New diagnosis of possible hyperthyroidism.  ParaGard IUD patient.  Hx of breast CA.  Status post bilateral mastectomy.   Normal vulvar anatomy.   Plan  Discussion of possible causes of abnormal uterine bleeding  including perimenopause, thyroid dysfunction, infection, hyperplasia, and cancer.  This is most likely hormonal in nature. Follow up EMB.  Instructions/precautions given.  Normal anatomy of the vulva emphasized to the patient.  She feels relieved to hear this.  I cautioned her to think a lot before considering pursuing surgery.  I feel that due to the labial folds, she will not achieve the symmetry that she may wants. I offered to refer to endocrinology for her potential hyperthyroidism. She will consider.   ___25____ minutes face to face time of which over 50% was spent in counseling.   After visit summary to patient.

## 2015-02-09 LAB — IPS OTHER TISSUE BIOPSY

## 2015-02-12 ENCOUNTER — Telehealth: Payer: Self-pay | Admitting: Obstetrics and Gynecology

## 2015-02-12 NOTE — Telephone Encounter (Addendum)
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 No endometritis. No hyperplasia or malignancy.   Patient denies pain and discomfort in the pelvis.  Would like to consider IUD removal, labioplasty, and tubal ligation (bilateral sapingectomy). I would recommend Doxycycline 100 mg po bid for 24 hours prior to IUD removal.  Will precert procedures.

## 2015-02-13 ENCOUNTER — Telehealth: Payer: Self-pay | Admitting: Obstetrics and Gynecology

## 2015-02-13 NOTE — Telephone Encounter (Signed)
Called to review surgery benefits. Left voicemail to return call.

## 2015-02-16 NOTE — Telephone Encounter (Signed)
Patient is returning a call to Sally. °

## 2015-02-16 NOTE — Telephone Encounter (Signed)
Return call to patient. Left message to call back. 

## 2015-02-16 NOTE — Telephone Encounter (Signed)
Spoke with patient regarding surgery benefit. Patient understands professional benefit only. Patient requested number to pre-service center and cpt codes to verify hospital benefit prior to scheduling. Patient states she has additional questions regarding recovery prior to scheduling in order to plan. Patient agreeable to return call for scheduling.

## 2015-02-16 NOTE — Telephone Encounter (Signed)
Return call to patient. Questions answered regarding post-op recovery. Patient will call when ready to schedule.  Routing to provider for final review. Patient agreeable to disposition. Will close encounter.    Routing to Dr Talbert Nan while Dr Quincy Simmonds out of office.

## 2015-02-27 ENCOUNTER — Telehealth: Payer: Self-pay | Admitting: Obstetrics and Gynecology

## 2015-02-27 DIAGNOSIS — Z30432 Encounter for removal of intrauterine contraceptive device: Secondary | ICD-10-CM

## 2015-02-27 NOTE — Telephone Encounter (Signed)
Patient calling wanting to speak to [facility] she said they had discussed an outpatient procedure, but she spoke with the hospital and got some new info so she says she needs a new plan. Best contact [phone]

## 2015-02-27 NOTE — Telephone Encounter (Signed)
Message left to return call to Pierre Dellarocco at [phone].    

## 2015-02-27 NOTE — Telephone Encounter (Signed)
Message left to return call to Bana Borgmeyer at [phone].    

## 2015-02-27 NOTE — Telephone Encounter (Signed)
Patient is returning a call to Tracy. °

## 2015-03-02 NOTE — Telephone Encounter (Signed)
Patient returned call. She states she discussed costs of procedures with pre-services department at [facility] and due to cost will not have tubal ligation.  Wants to research her other options regarding birth control.  Wants to continue with plan for labioplasty.

## 2015-03-02 NOTE — Telephone Encounter (Signed)
Case discussed with Thayer Ohm, Practice Administrator. Will confirm precert information regarding labioplasty and contact patient directly.    Routing to provider for final review. Will close encounter.

## 2015-03-02 NOTE — Telephone Encounter (Signed)
Routing update to Dr. Quincy Simmonds and Lamont Snowball, RN

## 2015-03-02 NOTE — Telephone Encounter (Signed)
Patient returning call.

## 2015-03-04 ENCOUNTER — Telehealth: Payer: Self-pay | Admitting: Obstetrics and Gynecology

## 2015-03-04 NOTE — Telephone Encounter (Signed)
Patient spoke to Thayer Ohm, practice administrator regarding precertification issues for elective surgery. Patient will let us know if she decides to proceed. If decides against BTSP or salpingectomy,  would like to know if having a new IUD inserted is a contraceptive option (after treatment with removal of current IUD and course antibiotics.) Is it possible to keep current IUD in place for 6-8 more months before removing and proceeding with antibiotic treatment. Advised needs consult to discuss options with Dr Quincy Simmonds. Patient agreeable but requests to at least know if IUD is an option after treatment so she can begin planning.

## 2015-03-04 NOTE — Telephone Encounter (Signed)
Spoke with patient regarding benefit for iud removal. Patient agreeable. Patient aware Barbara Ingram will call her regarding scheduling. Patient agreeable. Patient had questions regarding duration and cycle requirements for scheduling. Patient understands Barbara Ingram is able to address those questions during scheduling. Routing to [facility] for scheduling.

## 2015-03-05 NOTE — Telephone Encounter (Signed)
I am recommending treatment of Doxycycline 100 mg po bid for 24 hours prior to IUD removal and Doxycycline 100 mg po bid for 24 hours after IUD removal.  Dispense:  4 tabs, RF none.   Patient and I will need to discuss risks and benefits of having a new IUD. I would not place a new one at this time.  For patients who are not just colonized, but have true infection with Actinomyces related to an IUD, a future IUD is not recommended.  I do need to know if the patient is symptomatic, i.e. Having pelvic pain.  Previously, she has been asymptomatic.

## 2015-03-06 MED ORDER — DOXYCYCLINE HYCLATE 100 MG PO TABS: ORAL_TABLET | ORAL | Status: DC

## 2015-03-06 NOTE — Telephone Encounter (Signed)
Return call to patient, left message to call back. Can speak to triage nurses.

## 2015-03-06 NOTE — Telephone Encounter (Signed)
Patient returned call. Advised of Dr Elza Rafter response below.  Patient denies pelvic pain, fever, vaginal discharge or odor. States she has no signs of infection. She is aware she will need to discuss benefits and risks of new IUD with Dr Quincy Simmonds. Removal of current IUD scheduled for 03-18-15 at 1000. Advised to take Motrin (dose she takes for her menstrual cramps)  one hour prior to procedure with food. Rx to [facility]. Instructions reviewed verbally.

## 2015-03-18 ENCOUNTER — Ambulatory Visit (INDEPENDENT_AMBULATORY_CARE_PROVIDER_SITE_OTHER): Payer: 59 | Admitting: Obstetrics and Gynecology

## 2015-03-18 ENCOUNTER — Encounter: Payer: Self-pay | Admitting: Obstetrics and Gynecology

## 2015-03-18 DIAGNOSIS — Z30432 Encounter for removal of intrauterine contraceptive device: Secondary | ICD-10-CM | POA: Diagnosis not present

## 2015-03-18 NOTE — Progress Notes (Signed)
GYNECOLOGY  VISIT   HPI: 49 y.o.   Married  Caucasian  female   G2P2002 with Patient's last menstrual period was 03/10/2015.   here for IUD removal    Took Doxycycline in preparation for IUD removal.   Pelvic ultrasound 02/05/15: Uterus - no masses. IUD in place.  EMS - 4.18 mm. Ovaries - right ovarian cyst 28 x 20 mm with internal echoes - endometrioma vs. Hemorrhagic cyst. Follicle also present 14 x 17 mm. Left ovary normal.  Free fluid - no  Had EMB 02/05/15:\for irregular menses. Endometrial Biopsy  FINAL MICROSCOPIC DIAGNOSIS:  ENDOMETRIUM, BIOPSY:  -PROLIFERATIVE ENDOMETRIUM WITH GLANDULAR AND STROMAL BREAKDOWN  -NEGATIVE FOR ENDOMETRITIS, HYPERPLASIA OR CARCINOMA  -FOCAL ACTINOMYCOTIC GRANULES PRESENT  -ENDOCERVICAL MUCOSA WITH ACUTE AND CHRONIC CERVICITIS, AND SQUAMOUS METAPLASIA   Electronically Signed Out By vk/9/26/2016Vandana Dwyane Dee, [address] [place], [place]. Dir.: Dollene Primrose, MD   Denies pelvic pain or fever.  Going to [place] today. Dad in hospice.   GYNECOLOGIC HISTORY: Patient's last menstrual period was 03/10/2015. Contraception:  IUD  Menopausal hormone therapy: None Last mammogram: Bilateral Mastectomy  Last pap smear: Neg. HR HPV:neg        OB History    Gravida Para Term Preterm AB TAB SAB Ectopic Multiple Living   2 2 2       2          Patient Active Problem List   Diagnosis Date Noted  . Depression   . IBS (irritable bowel syndrome) 03/01/2011  . Yeast in stool 11/15/2010  . ADENOCARCINOMA, BREAST 06/03/2010  . LIPOMAS, MULTIPLE 06/03/2010  . THYROID STIMULATING HORMONE, ABNORMAL 06/03/2010  . ALLERGIC RHINITIS 06/03/2010  . FINGER PAIN 06/03/2010  . DIARRHEA 06/03/2010    Past Medical History  Diagnosis Date  . LIPOMAS, MULTIPLE   . ADENOCARCINOMA, BREAST dx 04/2003    lumpectomy, B mastectomy, no chemo  . ALLERGIC RHINITIS   . IBS (irritable bowel syndrome)   . Depression fall 2012  . THYROID STIMULATING  HORMONE, ABNORMAL 2012    hyperthyroidism  . Breast cancer 2005    right breast--in [place]    Past Surgical History  Procedure Laterality Date  . Breast biopsy  04/2003  . Breast lumpectomy  05/2003  . Mastectomy  05/2003    Bilateral--Hx Rt.breast cancer--in [place]  . Breast reconstruction    . Orif wrist fracture  01/07/2012    Procedure: OPEN REDUCTION INTERNAL FIXATION (ORIF) WRIST FRACTURE;  Surgeon: Roseanne Kaufman, MD;  Location: [facility];  Service: Orthopedics;  Laterality: Left;  excision lipoma right forearm  . Cesarean section  2003    Current Outpatient Prescriptions  Medication Sig Dispense Refill  . ALPRAZolam (XANAX) 0.25 MG tablet Take 1 tablet (0.25 mg total) by mouth 3 (three) times daily as needed for anxiety. 30 tablet 0  . Cholecalciferol (VITAMIN D PO) Take 1 tablet by mouth daily.    . Coenzyme Q10 (CO Q 10 PO) Take 1 tablet by mouth daily.    [name] doxycycline (VIBRA-TABS) 100 MG tablet Take 1 tablet by mouth twice daily as directed for procedure. 4 tablet 0  . fish oil-omega-3 fatty acids 1000 MG capsule Take 2 g by mouth daily.    . Multiple Vitamin (MULTIVITAMIN WITH MINERALS) TABS Take 1 tablet by mouth daily.    . Multiple Vitamins-Minerals (ZINC PO) Take 1 tablet by mouth daily.    . Naltrexone POWD 4.5 mg by Does not apply route at bedtime. 30 Bottle 6  .  RESTASIS 0.05 % ophthalmic emulsion Place 1 drop into both eyes daily.  1   No current facility-administered medications for this visit.     ALLERGIES: Keflex and Penicillins  Family History  Problem Relation Age of Onset  . Chronic bronchitis Mother   . Hypertension Father   . Hyperlipidemia Father   . Kidney disease Father   . Heart disease Father     Social History   Social History  . Marital Status: Married    Spouse Name: N/A  . Number of Children: 2  . Years of Education: N/A   Occupational History  .     Social History Main Topics  . Smoking status: Never Smoker   . Smokeless tobacco:  Never Used     Comment: Married, spouse is Doctor, hospital ([employer])- 2 kids. Moved to [place] fall 2011 [place] area  . Alcohol Use: 0.0 oz/week    0 Standard drinks or equivalent per week  . Drug Use: No  . Sexual Activity:    Partners: Male     Comment: IUD--copper inserted 01/2012   Other Topics Concern  . Not on file   Social History Narrative    ROS:  Pertinent items are noted in HPI.  PHYSICAL EXAMINATION:    BP 130/80 mmHg  Pulse 68  Resp 12  Ht 5\' 9"  (1.753 m)  Wt 135 lb (61.236 kg)  BMI 19.93 kg/m2  LMP 03/10/2015    General appearance: alert, cooperative and appears stated age  Pelvic: External genitalia:  no lesions              Urethra:  normal appearing urethra with no masses, tenderness or lesions              Bartholins and Skenes: normal                 Vagina: normal appearing vagina with normal color and discharge, no lesions              Cervix: no lesions and IUD strings seen.   IUD removed after verbal consent.  Cervix cleansed with Hibiclens, and then ring forceps used to remove IUD intact and then discard it.             Chaperone was present for exam.  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  Colonized.   ParaGard IUD removed. No PID.  PLAN  Counseled regarding Actinomyces and IUDs.  Doxycycline 100 mg po bid for 24 more hours for prophylaxis.  Monitor for PID.  Call for pain or fever. Discussed sponge, condoms and foam, and diaphragms and spermicide.  She will consider diaphragm. Follow up prn.   An After Visit Summary was printed and given to the patient.  ____10__ minutes face to face time of which over 50% was spent in counseling.

## 2015-06-09 DIAGNOSIS — F4323 Adjustment disorder with mixed anxiety and depressed mood: Secondary | ICD-10-CM | POA: Diagnosis not present

## 2015-06-19 ENCOUNTER — Telehealth: Payer: Self-pay | Admitting: Obstetrics and Gynecology

## 2015-06-19 NOTE — Telephone Encounter (Signed)
Patient left a message on our voicemail inquiring about HRT and testosterone pellets. Best # to reach: [phone]

## 2015-06-19 NOTE — Telephone Encounter (Signed)
Message left to return call to Barbara Ingram at [phone].    

## 2015-06-25 NOTE — Telephone Encounter (Signed)
Call to patient. She is advised that our physicians here do not place HRT Pellets and do not prescribe bio-identical hormones.   She is asking for recommendations of practices who may place HRT pellets. Advised can call local gyn office for recommendations. Patient would like to know if Dr. Quincy Simmonds has any knowledge of any providers who perform this service, she states she is not asking for a referral or recommendation of provider, just any ideas about where she may obtain more information about HRT pellets.

## 2015-06-25 NOTE — Telephone Encounter (Signed)
Message left to return call to Yuniel Blaney at [phone].    

## 2015-06-25 NOTE — Telephone Encounter (Signed)
Patient has a question for Dr.Miller's nurse regarding hormones.

## 2015-06-25 NOTE — Telephone Encounter (Signed)
Returning a call to Tracy °

## 2015-06-26 NOTE — Telephone Encounter (Signed)
I am not able to give the patient a specific recommendation regarding an office or practice to receive this information.  I would suggest the web site Up To Date as the best way to receive reliable information about hormone therapy including bioidentical hormones, which are not FDA regulated.  We may want to meet with an oncologist prior to initiating any hormone therapy due to her history of breast cancer.

## 2015-06-30 NOTE — Telephone Encounter (Signed)
Left message to call Barbara Ingram back -eh

## 2015-06-30 NOTE — Telephone Encounter (Signed)
I spoke with patient and gave her the info from Dr. Quincy Simmonds. She said that her oncologist has left the cancer center but she plans to meet with a new one soon and will discuss HRT with them as well. She is aware of the risk and says "it is scary". She also plans to [place] the web site listed below from Dr. Quincy Simmonds to find someone local that does the HRT-eh

## 2015-07-10 DIAGNOSIS — F4323 Adjustment disorder with mixed anxiety and depressed mood: Secondary | ICD-10-CM | POA: Diagnosis not present

## 2015-09-02 DIAGNOSIS — N959 Unspecified menopausal and perimenopausal disorder: Secondary | ICD-10-CM | POA: Diagnosis not present

## 2015-09-02 DIAGNOSIS — R7989 Other specified abnormal findings of blood chemistry: Secondary | ICD-10-CM | POA: Diagnosis not present

## 2015-12-10 DIAGNOSIS — E28310 Symptomatic premature menopause: Secondary | ICD-10-CM | POA: Diagnosis not present

## 2015-12-10 DIAGNOSIS — E039 Hypothyroidism, unspecified: Secondary | ICD-10-CM | POA: Diagnosis not present

## 2015-12-10 DIAGNOSIS — R7989 Other specified abnormal findings of blood chemistry: Secondary | ICD-10-CM | POA: Diagnosis not present

## 2016-03-07 ENCOUNTER — Ambulatory Visit (INDEPENDENT_AMBULATORY_CARE_PROVIDER_SITE_OTHER): Payer: 59 | Admitting: Obstetrics and Gynecology

## 2016-03-07 ENCOUNTER — Encounter: Payer: Self-pay | Admitting: Obstetrics and Gynecology

## 2016-03-07 VITALS — BP 110/64 | HR 66 | Resp 18 | Ht 69.5 in | Wt 137.0 lb

## 2016-03-07 DIAGNOSIS — Z01419 Encounter for gynecological examination (general) (routine) without abnormal findings: Secondary | ICD-10-CM | POA: Diagnosis not present

## 2016-03-07 DIAGNOSIS — Z1211 Encounter for screening for malignant neoplasm of colon: Secondary | ICD-10-CM

## 2016-03-07 DIAGNOSIS — Z1151 Encounter for screening for human papillomavirus (HPV): Secondary | ICD-10-CM | POA: Diagnosis not present

## 2016-03-07 NOTE — Progress Notes (Signed)
50 y.o. G22P2002 Divorced Caucasian female here for annual exam.    IUD removed due to signs of actinomyces granules on EMB done for menorrhagia.  Pathology showed proliferative endometrium and no endometritis.   Menses are short and scant.  Had 4 menses this year.  Receiving testosterone and aromatase inhibitor in buttocks, Sanda Klein, [place].  Having hormone monitoring.  She is aware she is taking hormones.  No hot flashes.  Feels really good.  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   New partner.  Declines STD testing.   PCP:  Enis Gash, MD    Patient's last menstrual period was 02/22/2016 (approximate).     Period Pattern: (!) Irregular     Sexually active: Yes.   female The current method of family planning is vasectomy.    Exercising: Yes.    Yoga, weights and dance Smoker:  no  Health Maintenance: Pap:  01-21-15 Neg:Neg HR HPV History of abnormal Pap:  Yes, 2014 Neg Pap but thinks she had Pos.HR HPV--no colposcopy and no treatment. MMG:  Bilateral mastectomy due to Rt.sided breast cancer in 2005 in [place]. Colonoscopy:  n/a BMD: 07/2013    Result: [facility] States she had DEXA done 2005 prior to treatment for Breast CA and was told she was Osteopenic. TDaP:  UNSURE.  Thinks it is less than 10 years.   Gardasil:   N/A HIV:  Declines.  Hep C:  NA. Screening Labs:  Hb today: PCP, Urine today: not done   reports that she has never smoked. She has never used smokeless tobacco. She reports that she drinks about 1.8 oz of alcohol per week . She reports that she does not use drugs.  Past Medical History:  Diagnosis Date  . ADENOCARCINOMA, BREAST dx 04/2003   lumpectomy, B mastectomy, no chemo  . ALLERGIC RHINITIS   . Breast cancer ([facility]) 2005   right breast--in [place]  . Depression fall 2012  . IBS (irritable bowel syndrome)   . LIPOMAS, MULTIPLE   . THYROID STIMULATING HORMONE, ABNORMAL 2012   hyperthyroidism     Past Surgical History:  Procedure Laterality Date  . BREAST BIOPSY  04/2003  . BREAST LUMPECTOMY  05/2003  . BREAST RECONSTRUCTION    . CESAREAN SECTION  2003  . MASTECTOMY  05/2003   Bilateral--Hx Rt.breast cancer--in [place]  . ORIF WRIST FRACTURE  01/07/2012   Procedure: OPEN REDUCTION INTERNAL FIXATION (ORIF) WRIST FRACTURE;  Surgeon: Roseanne Kaufman, MD;  Location: [facility];  Service: Orthopedics;  Laterality: Left;  excision lipoma right forearm    Current Outpatient Prescriptions  Medication Sig Dispense Refill  . Cholecalciferol (VITAMIN D PO) Take 1 tablet by mouth daily.    . Coenzyme Q10 (CO Q 10 PO) Take 1 tablet by mouth daily.    . fish oil-omega-3 fatty acids 1000 MG capsule Take 2 g by mouth daily.    . Multiple Vitamin (MULTIVITAMIN WITH MINERALS) TABS Take 1 tablet by mouth daily.    . Multiple Vitamins-Minerals (ZINC PO) Take 1 tablet by mouth daily.    . Naltrexone POWD 4.5 mg by Does not apply route at bedtime. 30 Bottle 6  . RESTASIS 0.05 % ophthalmic emulsion Place 1 drop into both eyes daily.  1   No current facility-administered medications for this visit.     Family History  Problem Relation Age of Onset  . Chronic bronchitis Mother   . Depression Mother   . Hypertension Father   .  Hyperlipidemia Father   . Kidney disease Father   . Heart disease Father   . Hypertension Brother   . Hyperlipidemia Brother     ROS:  Pertinent items are noted in HPI.  Otherwise, a comprehensive ROS was negative.  Exam:   BP 110/64 (BP Location: Right Arm, Patient Position: Sitting, Cuff Size: Normal)   Pulse 66   Resp 18   Ht 5' 9.5" (1.765 m)   Wt 137 lb (62.1 kg)   LMP 02/22/2016 (Approximate) Comment: spotting only  BMI 19.94 kg/m     General appearance: alert, cooperative and appears stated age Head: Normocephalic, without obvious abnormality, atraumatic Neck: no adenopathy, supple, symmetrical, trachea midline and thyroid normal to inspection and  palpation Lungs: clear to auscultation bilaterally Breasts:  Bilateral implants.  No masses or palpable nodes.  Heart: regular rate and rhythm Abdomen: soft, non-tender; no masses, no organomegaly Extremities: extremities normal, atraumatic, no cyanosis or edema Skin: Skin color, texture, turgor normal. No rashes or lesions Lymph nodes: Cervical, supraclavicular, and axillary nodes normal. No abnormal inguinal nodes palpated Neurologic: Grossly normal  Pelvic: External genitalia:  no lesions              Urethra:  normal appearing urethra with no masses, tenderness or lesions              Bartholins and Skenes: normal                 Vagina: normal appearing vagina with normal color and discharge, no lesions              Cervix: no lesions              Pap taken: Yes.   Bimanual Exam:  Uterus:  normal size, contour, position, consistency, mobility, non-tender              Adnexa: no mass, fullness, tenderness              Rectal exam: Yes.  .  Confirms.              Anus:  normal sphincter tone, no lesions  Chaperone was present for exam.  Assessment:   Well woman visit with normal exam. Hx right breast cancer.  BRCA negative.  Status post bilateral mastectomy.  Hx of abnormal pap/positive HR HPV.  No tx.  Doing hormone therapy with testosterone and aromatase inhibitor. Hx osteopenia dx in 2005.   Plan: Yearly mammogram recommended after age 24.  Recommended self breast exam.  Pap and HR HPV as above. Discussed Calcium, Vitamin D, regular exercise program including cardiovascular and weight bearing exercise. I discussed risks of hormone therapy and patient states she is aware and accepts risks.  Referral to [facility] for colonoscopy.  Follow up annually and prn.       After visit summary provided.

## 2016-03-07 NOTE — Patient Instructions (Signed)

## 2016-03-08 ENCOUNTER — Encounter: Payer: Self-pay | Admitting: Internal Medicine

## 2016-03-09 LAB — IPS PAP TEST WITH HPV

## 2016-04-12 DIAGNOSIS — Z79890 Hormone replacement therapy (postmenopausal): Secondary | ICD-10-CM | POA: Diagnosis not present

## 2016-04-12 DIAGNOSIS — E785 Hyperlipidemia, unspecified: Secondary | ICD-10-CM | POA: Diagnosis not present

## 2016-04-12 DIAGNOSIS — Z8249 Family history of ischemic heart disease and other diseases of the circulatory system: Secondary | ICD-10-CM | POA: Diagnosis not present

## 2016-04-12 DIAGNOSIS — R946 Abnormal results of thyroid function studies: Secondary | ICD-10-CM | POA: Diagnosis not present

## 2016-04-12 DIAGNOSIS — Z853 Personal history of malignant neoplasm of breast: Secondary | ICD-10-CM | POA: Diagnosis not present

## 2016-04-12 DIAGNOSIS — Z1211 Encounter for screening for malignant neoplasm of colon: Secondary | ICD-10-CM | POA: Diagnosis not present

## 2016-04-12 DIAGNOSIS — Z833 Family history of diabetes mellitus: Secondary | ICD-10-CM | POA: Diagnosis not present

## 2016-04-12 MED FILL — PROGESTERONE 100 MG CAPSULE: 100 | 90 days supply | Qty: 90 | Fill #0

## 2016-04-28 ENCOUNTER — Ambulatory Visit (AMBULATORY_SURGERY_CENTER): Payer: Self-pay | Admitting: *Deleted

## 2016-04-28 VITALS — Ht 69.5 in | Wt 134.4 lb

## 2016-04-28 DIAGNOSIS — Z1211 Encounter for screening for malignant neoplasm of colon: Secondary | ICD-10-CM

## 2016-04-28 NOTE — Progress Notes (Signed)
No egg or soy allergy  No anesthesia or intubation problems per pt  No diet medications taken  No home oxygen used or hx of sleep apnea  Pt has had right breast CA- no IV or BPs on right side

## 2016-05-12 ENCOUNTER — Ambulatory Visit (AMBULATORY_SURGERY_CENTER): Payer: 59 | Admitting: Internal Medicine

## 2016-05-12 ENCOUNTER — Encounter: Payer: Self-pay | Admitting: Internal Medicine

## 2016-05-12 VITALS — BP 109/53 | HR 65 | Temp 97.5°F | Resp 12 | Ht 69.0 in | Wt 134.0 lb

## 2016-05-12 DIAGNOSIS — Z1211 Encounter for screening for malignant neoplasm of colon: Secondary | ICD-10-CM

## 2016-05-12 DIAGNOSIS — Z1212 Encounter for screening for malignant neoplasm of rectum: Secondary | ICD-10-CM

## 2016-05-12 MED ORDER — SODIUM CHLORIDE 0.9 % IV SOLN: 500.0000 mL | INTRAVENOUS | Status: AC

## 2016-05-12 NOTE — Patient Instructions (Addendum)
   The colon was normal - no polyps or cancer seen.  Next routine colonoscopy/screening test in 10 years - 2027-8. YOU HAD AN ENDOSCOPIC PROCEDURE TODAY AT [facility]:   Refer to the procedure report that was given to you for any specific questions about what was found during the examination.  If the procedure report does not answer your questions, please call your gastroenterologist to clarify.  If you requested that your care partner not be given the details of your procedure findings, then the procedure report has been included in a sealed envelope for you to review at your convenience later.  YOU SHOULD EXPECT: Some feelings of bloating in the abdomen. Passage of more gas than usual.  Walking can help get rid of the air that was put into your GI tract during the procedure and reduce the bloating. If you had a lower endoscopy (such as a colonoscopy or flexible sigmoidoscopy) you may notice spotting of blood in your stool or on the toilet paper. If you underwent a bowel prep for your procedure, you may not have a normal bowel movement for a few days.  Please Note:  You might notice some irritation and congestion in your nose or some drainage.  This is from the oxygen used during your procedure.  There is no need for concern and it should clear up in a day or so.  SYMPTOMS TO REPORT IMMEDIATELY:   Following lower endoscopy (colonoscopy or flexible sigmoidoscopy):  Excessive amounts of blood in the stool  Significant tenderness or worsening of abdominal pains  Swelling of the abdomen that is new, acute  Fever of 100F or higher  For urgent or emergent issues, a gastroenterologist can be reached at any hour by calling [phone].   DIET:  We do recommend a small meal at first, but then you may proceed to your regular diet.  Drink plenty of fluids but you should avoid alcoholic beverages for 24 hours.  ACTIVITY:  You should plan to take it easy for the rest of today  and you should NOT DRIVE or use heavy machinery until tomorrow (because of the sedation medicines used during the test).    FOLLOW UP: Our staff will call the number listed on your records the next business day following your procedure to check on you and address any questions or concerns that you may have regarding the information given to you following your procedure. If we do not reach you, we will leave a message.  However, if you are feeling well and you are not experiencing any problems, there is no need to return our call.  We will assume that you have returned to your regular daily activities without incident.  If any biopsies were taken you will be contacted by phone or by letter within the next 1-3 weeks.  Please call us at [phone] if you have not heard about the biopsies in 3 weeks.    SIGNATURES/CONFIDENTIALITY: You and/or your care partner have signed paperwork which will be entered into your electronic medical record.  These signatures attest to the fact that that the information above on your After Visit Summary has been reviewed and is understood.  Full responsibility of the confidentiality of this discharge information lies with you and/or your care-partner.

## 2016-05-12 NOTE — Progress Notes (Signed)
Report to PACU, RN, vss, BBS= Clear.  

## 2016-05-12 NOTE — Op Note (Addendum)
[facility] Patient Name: Barbara Ingram Procedure Date: 05/12/2016 10:34 AM MRN: [id number] Endoscopist: Gatha Mayer , MD Age: 50 Referring MD:  Date of Birth: 04/25/1966 Gender: Female Account #: [id number] Procedure:                Colonoscopy Indications:              Screening for colorectal malignant neoplasm, This                            is the patient's first colonoscopy Medicines:                Propofol per Anesthesia, Monitored Anesthesia Care Procedure:                Pre-Anesthesia Assessment:                           - Prior to the procedure, a History and Physical                            was performed, and patient medications and                            allergies were reviewed. The patient's tolerance of                            previous anesthesia was also reviewed. The risks                            and benefits of the procedure and the sedation                            options and risks were discussed with the patient.                            All questions were answered, and informed consent                            was obtained. Prior Anticoagulants: The patient has                            taken no previous anticoagulant or antiplatelet                            agents. ASA Grade Assessment: II - A patient with                            mild systemic disease. After reviewing the risks                            and benefits, the patient was deemed in                            satisfactory condition to undergo the procedure.  After obtaining informed consent, the colonoscope                            was passed under direct vision. Throughout the                            procedure, the patient's blood pressure, pulse, and                            oxygen saturations were monitored continuously. The                            Model CF-HQ190L [id number]) scope was introduced   through the anus and advanced to the the cecum,                            identified by appendiceal orifice and ileocecal                            valve. The colonoscopy was performed without                            difficulty. The patient tolerated the procedure                            well. The quality of the bowel preparation was                            good. The bowel preparation used was Miralax. The                            appendiceal orifice and the rectum were                            photographed. Scope In: 10:42:06 AM Scope Out: 11:00:08 AM Scope Withdrawal Time: 0 hours 15 minutes 0 seconds  Total Procedure Duration: 0 hours 18 minutes 2 seconds  Findings:                 The perianal and digital rectal examinations were                            normal.                           The entire examined colon appeared normal on direct                            and retroflexion views. Complications:            No immediate complications. Estimated Blood Loss:     Estimated blood loss: none. Impression:               - The entire examined colon is normal on direct and  retroflexion views.                           - No specimens collected. Recommendation:           - Patient has a contact number available for                            emergencies. The signs and symptoms of potential                            delayed complications were discussed with the                            patient. Return to normal activities tomorrow.                            Written discharge instructions were provided to the                            patient.                           - Resume previous diet.                           - Continue present medications.                           - Repeat colonoscopy in 10 years for screening                            purposes. Gatha Mayer, MD 05/12/2016 11:07:33 AM This report has been signed  electronically.

## 2016-05-13 ENCOUNTER — Telehealth: Payer: Self-pay | Admitting: *Deleted

## 2016-05-13 NOTE — Telephone Encounter (Signed)
Message left

## 2016-05-17 ENCOUNTER — Telehealth: Payer: Self-pay

## 2016-05-17 NOTE — Telephone Encounter (Signed)
  Follow up Call-  Call back number 05/12/2016  Post procedure Call Back phone  # [phone]  Permission to leave phone message Yes  Some recent data might be hidden     Patient questions:  Do you have a fever, pain , or abdominal swelling? No. Pain Score  0 *  Have you tolerated food without any problems? Yes.    Have you been able to return to your normal activities? Yes.    Do you have any questions about your discharge instructions: Diet   No. Medications  No. Follow up visit  No.  Do you have questions or concerns about your Care? No.  Actions: * If pain score is 4 or above: No action needed, pain <4.

## 2016-05-20 MED FILL — ANASTROZOLE 1 MG TABLET: 1 | 84 days supply | Qty: 6 | Fill #0

## 2016-06-15 DIAGNOSIS — F4323 Adjustment disorder with mixed anxiety and depressed mood: Secondary | ICD-10-CM | POA: Diagnosis not present

## 2016-07-06 DIAGNOSIS — F5102 Adjustment insomnia: Secondary | ICD-10-CM | POA: Diagnosis not present

## 2016-07-06 DIAGNOSIS — F329 Major depressive disorder, single episode, unspecified: Secondary | ICD-10-CM | POA: Diagnosis not present

## 2016-07-08 ENCOUNTER — Ambulatory Visit (INDEPENDENT_AMBULATORY_CARE_PROVIDER_SITE_OTHER): Payer: 59 | Admitting: Psychology

## 2016-07-08 DIAGNOSIS — F330 Major depressive disorder, recurrent, mild: Secondary | ICD-10-CM

## 2016-07-15 ENCOUNTER — Ambulatory Visit (INDEPENDENT_AMBULATORY_CARE_PROVIDER_SITE_OTHER): Payer: 59 | Admitting: Psychology

## 2016-07-15 DIAGNOSIS — F330 Major depressive disorder, recurrent, mild: Secondary | ICD-10-CM | POA: Diagnosis not present

## 2016-07-18 MED FILL — PROGESTERONE 100 MG CAPSULE: 100 | 90 days supply | Qty: 90 | Fill #1

## 2016-07-22 DIAGNOSIS — H5203 Hypermetropia, bilateral: Secondary | ICD-10-CM | POA: Diagnosis not present

## 2016-07-29 ENCOUNTER — Ambulatory Visit: Payer: 59 | Admitting: Psychology

## 2016-08-03 DIAGNOSIS — F329 Major depressive disorder, single episode, unspecified: Secondary | ICD-10-CM | POA: Diagnosis not present

## 2016-08-08 ENCOUNTER — Ambulatory Visit (INDEPENDENT_AMBULATORY_CARE_PROVIDER_SITE_OTHER): Payer: 59 | Admitting: Psychology

## 2016-08-08 DIAGNOSIS — F330 Major depressive disorder, recurrent, mild: Secondary | ICD-10-CM | POA: Diagnosis not present

## 2016-08-16 DIAGNOSIS — D8989 Other specified disorders involving the immune mechanism, not elsewhere classified: Secondary | ICD-10-CM | POA: Diagnosis not present

## 2016-08-16 DIAGNOSIS — E039 Hypothyroidism, unspecified: Secondary | ICD-10-CM | POA: Diagnosis not present

## 2016-08-16 DIAGNOSIS — N951 Menopausal and female climacteric states: Secondary | ICD-10-CM | POA: Diagnosis not present

## 2016-08-16 DIAGNOSIS — E559 Vitamin D deficiency, unspecified: Secondary | ICD-10-CM | POA: Diagnosis not present

## 2016-08-16 DIAGNOSIS — E7212 Methylenetetrahydrofolate reductase deficiency: Secondary | ICD-10-CM | POA: Diagnosis not present

## 2016-08-16 DIAGNOSIS — Z853 Personal history of malignant neoplasm of breast: Secondary | ICD-10-CM | POA: Diagnosis not present

## 2016-08-16 DIAGNOSIS — R790 Abnormal level of blood mineral: Secondary | ICD-10-CM | POA: Diagnosis not present

## 2016-08-16 DIAGNOSIS — R7982 Elevated C-reactive protein (CRP): Secondary | ICD-10-CM | POA: Diagnosis not present

## 2016-08-16 DIAGNOSIS — Z79890 Hormone replacement therapy (postmenopausal): Secondary | ICD-10-CM | POA: Diagnosis not present

## 2016-08-26 ENCOUNTER — Ambulatory Visit: Payer: 59 | Admitting: Psychology

## 2016-09-04 DIAGNOSIS — E349 Endocrine disorder, unspecified: Secondary | ICD-10-CM | POA: Diagnosis not present

## 2016-09-04 DIAGNOSIS — R5383 Other fatigue: Secondary | ICD-10-CM | POA: Diagnosis not present

## 2016-09-04 DIAGNOSIS — N951 Menopausal and female climacteric states: Secondary | ICD-10-CM | POA: Diagnosis not present

## 2016-11-17 MED FILL — PROGESTERONE 100 MG CAPSULE: 100 | 30 days supply | Qty: 30 | Fill #0

## 2016-11-24 DIAGNOSIS — E559 Vitamin D deficiency, unspecified: Secondary | ICD-10-CM | POA: Diagnosis not present

## 2016-11-24 DIAGNOSIS — N951 Menopausal and female climacteric states: Secondary | ICD-10-CM | POA: Diagnosis not present

## 2016-11-24 DIAGNOSIS — Z79890 Hormone replacement therapy (postmenopausal): Secondary | ICD-10-CM | POA: Diagnosis not present

## 2016-11-24 DIAGNOSIS — D8989 Other specified disorders involving the immune mechanism, not elsewhere classified: Secondary | ICD-10-CM | POA: Diagnosis not present

## 2016-11-24 DIAGNOSIS — E039 Hypothyroidism, unspecified: Secondary | ICD-10-CM | POA: Diagnosis not present

## 2016-11-24 DIAGNOSIS — Z853 Personal history of malignant neoplasm of breast: Secondary | ICD-10-CM | POA: Diagnosis not present

## 2016-11-24 DIAGNOSIS — K580 Irritable bowel syndrome with diarrhea: Secondary | ICD-10-CM | POA: Diagnosis not present

## 2017-01-09 ENCOUNTER — Other Ambulatory Visit: Payer: Self-pay | Admitting: Endocrinology

## 2017-01-09 ENCOUNTER — Other Ambulatory Visit (HOSPITAL_COMMUNITY): Payer: Self-pay | Admitting: General Practice

## 2017-01-11 ENCOUNTER — Other Ambulatory Visit (HOSPITAL_COMMUNITY): Payer: Self-pay | Admitting: General Practice

## 2017-01-11 DIAGNOSIS — R5382 Chronic fatigue, unspecified: Secondary | ICD-10-CM

## 2017-01-11 DIAGNOSIS — G9332 Myalgic encephalomyelitis/chronic fatigue syndrome: Secondary | ICD-10-CM

## 2017-01-24 DIAGNOSIS — F4323 Adjustment disorder with mixed anxiety and depressed mood: Secondary | ICD-10-CM | POA: Diagnosis not present

## 2017-02-08 DIAGNOSIS — F331 Major depressive disorder, recurrent, moderate: Secondary | ICD-10-CM | POA: Diagnosis not present

## 2017-02-13 ENCOUNTER — Ambulatory Visit (INDEPENDENT_AMBULATORY_CARE_PROVIDER_SITE_OTHER): Payer: 59 | Admitting: Psychology

## 2017-02-13 DIAGNOSIS — F330 Major depressive disorder, recurrent, mild: Secondary | ICD-10-CM

## 2017-02-16 DIAGNOSIS — E559 Vitamin D deficiency, unspecified: Secondary | ICD-10-CM | POA: Diagnosis not present

## 2017-02-16 DIAGNOSIS — N951 Menopausal and female climacteric states: Secondary | ICD-10-CM | POA: Diagnosis not present

## 2017-02-16 DIAGNOSIS — D8989 Other specified disorders involving the immune mechanism, not elsewhere classified: Secondary | ICD-10-CM | POA: Diagnosis not present

## 2017-02-16 DIAGNOSIS — Z79890 Hormone replacement therapy (postmenopausal): Secondary | ICD-10-CM | POA: Diagnosis not present

## 2017-02-16 DIAGNOSIS — E039 Hypothyroidism, unspecified: Secondary | ICD-10-CM | POA: Diagnosis not present

## 2017-03-13 ENCOUNTER — Encounter: Payer: Self-pay | Admitting: Obstetrics and Gynecology

## 2017-03-13 ENCOUNTER — Ambulatory Visit (INDEPENDENT_AMBULATORY_CARE_PROVIDER_SITE_OTHER): Payer: 59 | Admitting: Obstetrics and Gynecology

## 2017-03-13 VITALS — BP 100/62 | HR 64 | Resp 14 | Ht 69.25 in | Wt 143.0 lb

## 2017-03-13 DIAGNOSIS — N939 Abnormal uterine and vaginal bleeding, unspecified: Secondary | ICD-10-CM | POA: Diagnosis not present

## 2017-03-13 DIAGNOSIS — Z01419 Encounter for gynecological examination (general) (routine) without abnormal findings: Secondary | ICD-10-CM

## 2017-03-13 NOTE — Progress Notes (Signed)
51 y.o. G52P2002 Married Caucasian female here for annual exam.    Having a thyroid uptake evaluation next week for overactive thyroid.  Dr. Annamaria Boots monitoring.   PCP:  Leeroy Cha, MD  Dr. Gaynelle Cage - [facility] [place].    Patient's last menstrual period was 05/16/2016 (approximate).  But has occasional spotting about every 3 months during the last year.   Has testosterone pellets with aromatase inhibitor and progesterone for the last 20 months.  No hot flashes.        Sexually active: Yes.   female The current method of family planning is none.    Exercising: Yes.    Yoga, weights, dance, swimming Smoker:  no  Health Maintenance: Pap: 03-07-16 Neg:Neg HR HPV, 01-21-15 Neg:Neg HR HPV History of abnormal Pap:  Yes, 2014 Neg Pap but thinks she had Pos.HR HPV--no colposcopy and no treatment. MMG:  Bilateral mastectomy due to Rt.sided breast cancer in 2005 in [place]. Colonoscopy: 05-12-16 normal with Dr.Gessner;next due 04/2026. BMD: 07/2013  Result : Normal:[facility](pt. States she had DEXA done 2005 prior to treatment for Breast CA and was told she was Osteopenic. TDaP:  DECLINES Gardasil:   no HIV:01-21-15 NR Hep C:01-21-15 Neg Screening Labs:  Hb today: PCP, Urine today: Not done   reports that she has never smoked. She has never used smokeless tobacco. She reports that she drinks about 3.0 oz of alcohol per week . She reports that she does not use drugs.  Past Medical History:  Diagnosis Date  . ADENOCARCINOMA, BREAST dx 04/2003   lumpectomy, B mastectomy, no chemo  . ALLERGIC RHINITIS   . Breast cancer ([place]) 2005   right breast--in [place]  . IBS (irritable bowel syndrome)   . LIPOMAS, MULTIPLE   . THYROID STIMULATING HORMONE, ABNORMAL 2012   hyperthyroidism- no medication needed    Past Surgical History:  Procedure Laterality Date  . BREAST BIOPSY  04/2003  . BREAST LUMPECTOMY  05/2003  . BREAST RECONSTRUCTION    . CESAREAN SECTION  2003  .  MASTECTOMY  05/2003   Bilateral--Hx Rt.breast cancer--in [place]  . ORIF WRIST FRACTURE  01/07/2012   Procedure: OPEN REDUCTION INTERNAL FIXATION (ORIF) WRIST FRACTURE;  Surgeon: Roseanne Kaufman, MD;  Location: [facility];  Service: Orthopedics;  Laterality: Left;  excision lipoma right forearm    Current Outpatient Prescriptions  Medication Sig Dispense Refill  . Cholecalciferol (VITAMIN D PO) Take 1 tablet by mouth daily.    . Coenzyme Q10 (CO Q 10 PO) Take 1 tablet by mouth daily.    . Multiple Vitamin (MULTIVITAMIN WITH MINERALS) TABS Take 1 tablet by mouth daily.    . Naltrexone POWD 4.5 mg by Does not apply route at bedtime. 30 Bottle 6  . RESTASIS 0.05 % ophthalmic emulsion Place 1 drop into both eyes daily.  1   Current Facility-Administered Medications  Medication Dose Route Frequency Provider Last Rate Last Dose  . 0.9 %  sodium chloride infusion  500 mL Intravenous Continuous Gatha Mayer, MD        Family History  Problem Relation Age of Onset  . Chronic bronchitis Mother   . Depression Mother   . Hypertension Father   . Hyperlipidemia Father   . Kidney disease Father   . Heart disease Father   . Hypertension Brother   . Hyperlipidemia Brother   . Colon cancer Neg Hx   . Esophageal cancer Neg Hx   . Stomach cancer Neg Hx   .  Rectal cancer Neg Hx     ROS:  Pertinent items are noted in HPI.  Otherwise, a comprehensive ROS was negative.  Exam:   BP 100/62 (BP Location: Left Arm, Patient Position: Sitting, Cuff Size: Normal)   Pulse 64   Resp 14   Ht 5' 9.25" (1.759 m)   Wt 143 lb (64.9 kg)   LMP 05/16/2016 (Approximate)   BMI 20.97 kg/m     General appearance: alert, cooperative and appears stated age Head: Normocephalic, without obvious abnormality, atraumatic Neck: no adenopathy, supple, symmetrical, trachea midline and thyroid normal to inspection and palpation Lungs: clear to auscultation bilaterally Breasts:  Consistent with mastectomy and reconstruction, no  masses or tenderness, No nipple retraction or dimpling, No nipple discharge or bleeding, No axillary or supraclavicular adenopathy Heart: regular rate and rhythm Abdomen: soft, non-tender; no masses, no organomegaly Extremities: extremities normal, atraumatic, no cyanosis or edema Skin: Skin color, texture, turgor normal. No rashes or lesions Lymph nodes: Cervical, supraclavicular, and axillary nodes normal. No abnormal inguinal nodes palpated Neurologic: Grossly normal  Pelvic: External genitalia:  no lesions              Urethra:  normal appearing urethra with no masses, tenderness or lesions              Bartholins and Skenes: normal                 Vagina: normal appearing vagina with normal color and discharge, no lesions              Cervix: no lesions.  Menstrual bleeding today.               Pap taken: No. Bimanual Exam:  Uterus:  normal size, contour, position, consistency, mobility, non-tender              Adnexa: no mass, fullness, tenderness              Rectal exam: Yes.  .  Confirms.              Anus:  normal sphincter tone, no lesions  Chaperone was present for exam.  Assessment:   Well woman visit with normal exam. Hx right breast cancer.  BRCA negative.  Status post bilateral mastectomy.  Hx of abnormal pap/positive HR HPV.  No tx.  Doing hormone therapy with testosterone, progesterone, and aromatase inhibitor. Hx osteopenia dx in 2005.  Hyperthyroidism.     Plan: Mammogram screening discussed. Recommended self breast awareness. Pap and HR HPV as above. Guidelines for Calcium, Vitamin D, regular exercise program including cardiovascular and weight bearing exercise. Will check FSH, estradiol, lipids, CMP, and CBC.  I discussed risks of her hormone therapy and recurrent breast cancer.  She states she is comfortable using the hormones.  She will monitor her cycles.  Follow up annually and prn.   After visit summary provided.

## 2017-03-13 NOTE — Patient Instructions (Signed)

## 2017-03-14 ENCOUNTER — Ambulatory Visit (INDEPENDENT_AMBULATORY_CARE_PROVIDER_SITE_OTHER): Payer: 59 | Admitting: Psychology

## 2017-03-14 DIAGNOSIS — F330 Major depressive disorder, recurrent, mild: Secondary | ICD-10-CM | POA: Diagnosis not present

## 2017-03-14 LAB — COMPREHENSIVE METABOLIC PANEL
ALT: 15 IU/L (ref 0–32)
AST: 22 IU/L (ref 0–40)
Albumin/Globulin Ratio: 1.8 (ref 1.2–2.2)
Albumin: 4.6 g/dL (ref 3.5–5.5)
Alkaline Phosphatase: 62 IU/L (ref 39–117)
BUN/Creatinine Ratio: 13 (ref 9–23)
BUN: 12 mg/dL (ref 6–24)
Bilirubin Total: 0.6 mg/dL (ref 0.0–1.2)
CO2: 24 mmol/L (ref 20–29)
Calcium: 9 mg/dL (ref 8.7–10.2)
Chloride: 101 mmol/L (ref 96–106)
Creatinine, Ser: 0.96 mg/dL (ref 0.57–1.00)
GFR calc Af Amer: 80 mL/min/{1.73_m2} (ref >59)
GFR calc non Af Amer: 69 mL/min/{1.73_m2} (ref >59)
Globulin, Total: 2.5 g/dL (ref 1.5–4.5)
Glucose: 101 mg/dL — ABNORMAL HIGH (ref 65–99)
Potassium: 4 mmol/L (ref 3.5–5.2)
Sodium: 140 mmol/L (ref 134–144)
Total Protein: 7.1 g/dL (ref 6.0–8.5)

## 2017-03-14 LAB — LIPID PANEL
Chol/HDL Ratio: 1.8 ratio (ref 0.0–4.4)
Cholesterol, Total: 173 mg/dL (ref 100–199)
HDL: 96 mg/dL (ref >39)
LDL Calculated: 59 mg/dL (ref 0–99)
Triglycerides: 92 mg/dL (ref 0–149)
VLDL Cholesterol Cal: 18 mg/dL (ref 5–40)

## 2017-03-14 LAB — ESTRADIOL: Estradiol: 183.4 pg/mL

## 2017-03-14 LAB — CBC
Hematocrit: 39 % (ref 34.0–46.6)
Hemoglobin: 12.8 g/dL (ref 11.1–15.9)
MCH: 29.7 pg (ref 26.6–33.0)
MCHC: 32.8 g/dL (ref 31.5–35.7)
MCV: 91 fL (ref 79–97)
Platelets: 317 10*3/uL (ref 150–379)
RBC: 4.31 x10E6/uL (ref 3.77–5.28)
RDW: 14.3 % (ref 12.3–15.4)
WBC: 5 10*3/uL (ref 3.4–10.8)

## 2017-03-14 LAB — FOLLICLE STIMULATING HORMONE: FSH: 12.7 m[IU]/mL

## 2017-03-15 ENCOUNTER — Ambulatory Visit: Payer: 59 | Admitting: Psychology

## 2017-03-23 ENCOUNTER — Telehealth: Payer: Self-pay | Admitting: Obstetrics and Gynecology

## 2017-03-23 NOTE — Telephone Encounter (Signed)
Patient was seen last week and feels that she has a UTI. Patient is asking if a prescription could be called to her pharmacy since she was just seen last week?

## 2017-03-23 NOTE — Telephone Encounter (Signed)
Spoke with patient. Patient is having burning with urination that began yesterday. States that discomfort is becoming more severe as the day progresses. Denies any lower back pain, fever, or chills. Advised will need to be seen in the office for further evaluation and testing. Reviewed with Lamont Snowball, RN. Advised patient may be seen at a local Urgent Care or be seen in the office tomorrow morning and take OTC AZO for symptom relief. Appointment scheduled for 03/24/2017 at 8:45 am with Dr.Silva. Patient verbalizes understanding. Will be seen at a local Urgent Care if symptoms worsen over night.  Routing to provider for final review. Patient agreeable to disposition. Will close encounter.

## 2017-03-23 NOTE — Progress Notes (Signed)
GYNECOLOGY  VISIT   HPI: 51 y.o.   Legally Separated  Caucasian  female   G2P2002 with Patient's last menstrual period was 03/13/2017 (exact date).   here for urinary frequency and dysuria.  No fevers, back pain, nausea or vomiting.   On Pyridium 100 mg OTC.   Urine dip - 1+ WBCs and 1+ RBcs.   GYNECOLOGIC HISTORY: Patient's last menstrual period was 03/13/2017 (exact date). Contraception:  none Menopausal hormone therapy:  none Last mammogram:  Bilateral mastectomy due to Rt.sided breast cancer in 2005 in [place]. Last pap smear:  03-07-16 Neg:Neg HR HPV, 01-21-15 Neg:Neg HR HPV         OB History    Gravida Para Term Preterm AB Living   2 2 2     2    SAB TAB Ectopic Multiple Live Births                     Patient Active Problem List   Diagnosis Date Noted  . Depression   . IBS (irritable bowel syndrome) 03/01/2011  . Yeast in stool 11/15/2010  . ADENOCARCINOMA, BREAST 06/03/2010  . LIPOMAS, MULTIPLE 06/03/2010  . THYROID STIMULATING HORMONE, ABNORMAL 06/03/2010  . ALLERGIC RHINITIS 06/03/2010  . FINGER PAIN 06/03/2010  . DIARRHEA 06/03/2010    Past Medical History:  Diagnosis Date  . ADENOCARCINOMA, BREAST dx 04/2003   lumpectomy, B mastectomy, no chemo  . ALLERGIC RHINITIS   . Breast cancer ([place]) 2005   right breast--in [place]  . IBS (irritable bowel syndrome)   . LIPOMAS, MULTIPLE   . THYROID STIMULATING HORMONE, ABNORMAL 2012   hyperthyroidism- no medication needed    Past Surgical History:  Procedure Laterality Date  . BREAST BIOPSY  04/2003  . BREAST LUMPECTOMY  05/2003  . BREAST RECONSTRUCTION    . CESAREAN SECTION  2003  . MASTECTOMY  05/2003   Bilateral--Hx Rt.breast cancer--in [place]    Current Outpatient Medications  Medication Sig Dispense Refill  . Cholecalciferol (VITAMIN D PO) Take 1 tablet by mouth daily.    . Coenzyme Q10 (CO Q 10 PO) Take 1 tablet by mouth daily.    . Multiple Vitamin (MULTIVITAMIN WITH MINERALS) TABS Take 1 tablet by  mouth daily.    . Naltrexone POWD 4.5 mg by Does not apply route at bedtime. 30 Bottle 6  . RESTASIS 0.05 % ophthalmic emulsion Place 1 drop into both eyes daily.  1   Current Facility-Administered Medications  Medication Dose Route Frequency Provider Last Rate Last Dose  . 0.9 %  sodium chloride infusion  500 mL Intravenous Continuous Gatha Mayer, MD         ALLERGIES: Keflex [cephalexin] and Penicillins  Family History  Problem Relation Age of Onset  . Chronic bronchitis Mother   . Depression Mother   . Hypertension Father   . Hyperlipidemia Father   . Kidney disease Father   . Heart disease Father   . Hypertension Brother   . Hyperlipidemia Brother   . Colon cancer Neg Hx   . Esophageal cancer Neg Hx   . Stomach cancer Neg Hx   . Rectal cancer Neg Hx     Social History   Socioeconomic History  . Marital status: Legally Separated    Spouse name: Not on file  . Number of children: 2  . Years of education: Not on file  . Highest education level: Not on file  Social Needs  . Financial resource strain: Not on file  .  Food insecurity - worry: Not on file  . Food insecurity - inability: Not on file  . Transportation needs - medical: Not on file  . Transportation needs - non-medical: Not on file  Occupational History    Employer: SELFEMPLOYED  Tobacco Use  . Smoking status: Never Smoker  . Smokeless tobacco: Never Used  Substance and Sexual Activity  . Alcohol use: Yes    Alcohol/week: 3.0 oz    Types: 5 Glasses of wine per week  . Drug use: No  . Sexual activity: Yes    Partners: Male    Birth control/protection: None  Other Topics Concern  . Not on file  Social History Narrative  . Not on file    ROS:  Pertinent items are noted in HPI.  PHYSICAL EXAMINATION:    BP 122/64 (BP Location: Right Arm, Patient Position: Sitting, Cuff Size: Normal)   Pulse 60   Ht 5' 9.25" (1.759 m)   Wt 143 lb (64.9 kg)   LMP 03/13/2017 (Exact Date)   BMI 20.97 kg/m      General appearance: alert, cooperative and appears stated age  ASSESSMENT  UTI.   PLAN  Urine micro and culture.  Bactrim DS po bid x 3 days.  AZO standard 100 mg, take 2 po tid prn x 2 - 3 days.  To urgent care this weekend if develop fever, nausea/vomiting, back pain or worsening pain.    An After Visit Summary was printed and given to the patient.  ___15___ minutes face to face time of which over 50% was spent in counseling.

## 2017-03-24 ENCOUNTER — Ambulatory Visit (INDEPENDENT_AMBULATORY_CARE_PROVIDER_SITE_OTHER): Payer: 59 | Admitting: Obstetrics and Gynecology

## 2017-03-24 ENCOUNTER — Encounter: Payer: Self-pay | Admitting: Obstetrics and Gynecology

## 2017-03-24 VITALS — BP 122/64 | HR 60 | Ht 69.25 in | Wt 143.0 lb

## 2017-03-24 DIAGNOSIS — R300 Dysuria: Secondary | ICD-10-CM | POA: Diagnosis not present

## 2017-03-24 DIAGNOSIS — R319 Hematuria, unspecified: Secondary | ICD-10-CM | POA: Diagnosis not present

## 2017-03-24 LAB — POCT URINALYSIS DIPSTICK
Bilirubin, UA: NEGATIVE
Glucose, UA: NEGATIVE
Ketones, UA: NEGATIVE
Nitrite, UA: NEGATIVE
Urobilinogen, UA: 0.2 E.U./dL
pH, UA: 5 (ref 5.0–8.0)

## 2017-03-24 MED ORDER — SULFAMETHOXAZOLE-TRIMETHOPRIM 800-160 MG PO TABS: 1.0000 | ORAL_TABLET | Freq: Two times a day (BID) | ORAL | 0 refills | Status: DC

## 2017-03-24 NOTE — Addendum Note (Signed)
Addended by: Lowella Fairy on: 03/24/2017 09:55 AM   Modules accepted: Orders

## 2017-03-24 NOTE — Patient Instructions (Signed)
Take AZO standard 100 mg, take 2 by mouth three times a day as needed for 2 -3 days.  This turns your urine orange in color.    Urinary Tract Infection, Adult A urinary tract infection (UTI) is an infection of any part of the urinary tract. The urinary tract includes the:  Kidneys.  Ureters.  Bladder.  Urethra.  These organs make, store, and get rid of pee (urine) in the body. Follow these instructions at home:  Take over-the-counter and prescription medicines only as told by your doctor.  If you were prescribed an antibiotic medicine, take it as told by your doctor. Do not stop taking the antibiotic even if you start to feel better.  Avoid the following drinks: ? Alcohol. ? Caffeine. ? Tea. ? Carbonated drinks.  Drink enough fluid to keep your pee clear or pale yellow.  Keep all follow-up visits as told by your doctor. This is important.  Make sure to: ? Empty your bladder often and completely. Do not to hold pee for long periods of time. ? Empty your bladder before and after sex. ? Wipe from front to back after a bowel movement if you are female. Use each tissue one time when you wipe. Contact a doctor if:  You have back pain.  You have a fever.  You feel sick to your stomach (nauseous).  You throw up (vomit).  Your symptoms do not get better after 3 days.  Your symptoms go away and then come back. Get help right away if:  You have very bad back pain.  You have very bad lower belly (abdominal) pain.  You are throwing up and cannot keep down any medicines or water. This information is not intended to replace advice given to you by your health care provider. Make sure you discuss any questions you have with your health care provider. Document Released: 10/19/2007 Document Revised: 10/08/2015 Document Reviewed: 03/23/2015 Elsevier Interactive Patient Education  [employer].

## 2017-03-25 LAB — URINALYSIS, MICROSCOPIC ONLY: Casts: NONE SEEN /lpf

## 2017-03-26 LAB — URINE CULTURE

## 2017-03-28 ENCOUNTER — Ambulatory Visit (HOSPITAL_COMMUNITY)
Admission: RE | Admit: 2017-03-28 | Discharge: 2017-03-28 | Disposition: A | Discharge status: Home / Self Care | Payer: 59 | Source: Ambulatory Visit | Attending: General Practice | Admitting: General Practice

## 2017-03-28 DIAGNOSIS — N951 Menopausal and female climacteric states: Secondary | ICD-10-CM | POA: Insufficient documentation

## 2017-03-28 DIAGNOSIS — D8989 Other specified disorders involving the immune mechanism, not elsewhere classified: Secondary | ICD-10-CM | POA: Diagnosis not present

## 2017-03-28 DIAGNOSIS — R6889 Other general symptoms and signs: Secondary | ICD-10-CM | POA: Insufficient documentation

## 2017-03-28 DIAGNOSIS — R946 Abnormal results of thyroid function studies: Secondary | ICD-10-CM | POA: Diagnosis not present

## 2017-03-28 DIAGNOSIS — R5382 Chronic fatigue, unspecified: Secondary | ICD-10-CM | POA: Insufficient documentation

## 2017-03-28 DIAGNOSIS — G9332 Myalgic encephalomyelitis/chronic fatigue syndrome: Secondary | ICD-10-CM

## 2017-03-28 MED ORDER — SODIUM IODIDE I 131 CAPSULE
17.5000 | Freq: Once | INTRAVENOUS | Status: AC | PRN
  Administered 2017-03-28: 17.5 via ORAL

## 2017-03-29 ENCOUNTER — Encounter (HOSPITAL_COMMUNITY)
Admission: RE | Admit: 2017-03-29 | Discharge: 2017-03-29 | Disposition: A | Discharge status: Home / Self Care | Payer: 59 | Source: Ambulatory Visit | Attending: General Practice | Admitting: General Practice

## 2017-03-29 DIAGNOSIS — R946 Abnormal results of thyroid function studies: Secondary | ICD-10-CM | POA: Diagnosis not present

## 2017-03-29 MED ORDER — SODIUM PERTECHNETATE TC 99M INJECTION
9.4000 | Freq: Once | INTRAVENOUS | Status: AC | PRN
  Administered 2017-03-29: 9.4 via INTRAVENOUS

## 2017-04-13 ENCOUNTER — Ambulatory Visit (INDEPENDENT_AMBULATORY_CARE_PROVIDER_SITE_OTHER): Payer: 59 | Admitting: Obstetrics & Gynecology

## 2017-04-13 ENCOUNTER — Telehealth: Payer: Self-pay | Admitting: Obstetrics and Gynecology

## 2017-04-13 VITALS — BP 110/70 | HR 68 | Temp 98.1°F | Resp 16 | Ht 69.25 in | Wt 142.0 lb

## 2017-04-13 DIAGNOSIS — R300 Dysuria: Secondary | ICD-10-CM | POA: Diagnosis not present

## 2017-04-13 LAB — POCT URINALYSIS DIPSTICK
Bilirubin, UA: NEGATIVE
Glucose, UA: NEGATIVE
Ketones, UA: NEGATIVE
Nitrite, UA: NEGATIVE
Protein, UA: NEGATIVE
Urobilinogen, UA: 0.2 E.U./dL
pH, UA: 5 (ref 5.0–8.0)

## 2017-04-13 MED ORDER — NITROFURANTOIN MONOHYD MACRO 100 MG PO CAPS: 100.0000 mg | ORAL_CAPSULE | Freq: Two times a day (BID) | ORAL | 0 refills | Status: DC

## 2017-04-13 NOTE — Progress Notes (Signed)
GYNECOLOGY  VISIT  CC:   Painful urination  HPI: 51 y.o. G65P2002 Legally Separated Caucasian female here for complaints of increased urinary frequency and urgency as well as dysuria.  Typically sees Dr. Quincy Simmonds who is unavailable today.    Was diagnosed with cystitis 03/24/17 and urine culture showed e. Coli that was sensitive to all antibiotics.  Pt was treated appropriately with Bactrim DS bid x 3 days.  Pt felt symptoms resolved but then came back very quickly.  Pt reports she does have a new sexual partner and wonders if this is part of the problem.  Happy with relationship, however.  Recurrent UTIs discussed including definitive and possible treatment options.  She would consider suppressive antibiotic therapy with coitus if issues continue.  Denies fever, back pain, abnormal vaginal discharge or odor.  GYNECOLOGIC HISTORY: Contraception: none Menopausal hormone therapy: testosterone pellets with aromatase inhibitor and progesterone  Patient Active Problem List   Diagnosis Date Noted  . Depression   . IBS (irritable bowel syndrome) 03/01/2011  . Yeast in stool 11/15/2010  . ADENOCARCINOMA, BREAST 06/03/2010  . LIPOMAS, MULTIPLE 06/03/2010  . THYROID STIMULATING HORMONE, ABNORMAL 06/03/2010  . ALLERGIC RHINITIS 06/03/2010  . FINGER PAIN 06/03/2010  . DIARRHEA 06/03/2010    Past Medical History:  Diagnosis Date  . ADENOCARCINOMA, BREAST dx 04/2003   lumpectomy, B mastectomy, no chemo  . ALLERGIC RHINITIS   . Breast cancer ([place]) 2005   right breast--in [place]  . IBS (irritable bowel syndrome)   . LIPOMAS, MULTIPLE   . THYROID STIMULATING HORMONE, ABNORMAL 2012   hyperthyroidism- no medication needed    Past Surgical History:  Procedure Laterality Date  . BREAST BIOPSY  04/2003  . BREAST LUMPECTOMY  05/2003  . BREAST RECONSTRUCTION    . CESAREAN SECTION  2003  . MASTECTOMY  05/2003   Bilateral--Hx Rt.breast cancer--in [place]  . ORIF WRIST FRACTURE  01/07/2012   Procedure: OPEN REDUCTION INTERNAL FIXATION (ORIF) WRIST FRACTURE;  Surgeon: Roseanne Kaufman, MD;  Location: [facility];  Service: Orthopedics;  Laterality: Left;  excision lipoma right forearm    MEDS:   Current Outpatient Medications on File Prior to Visit  Medication Sig Dispense Refill  . Cholecalciferol (VITAMIN D PO) Take 1 tablet by mouth daily.    . Coenzyme Q10 (CO Q 10 PO) Take 1 tablet by mouth daily.    . Multiple Vitamin (MULTIVITAMIN WITH MINERALS) TABS Take 1 tablet by mouth daily.    . Naltrexone POWD 4.5 mg by Does not apply route at bedtime. 30 Bottle 6  . RESTASIS 0.05 % ophthalmic emulsion Place 1 drop into both eyes daily.  1  . sulfamethoxazole-trimethoprim (BACTRIM DS) 800-160 MG tablet Take 1 tablet 2 (two) times daily by mouth. One PO BID x 3 days 6 tablet 0   Current Facility-Administered Medications on File Prior to Visit  Medication Dose Route Frequency Provider Last Rate Last Dose  . 0.9 %  sodium chloride infusion  500 mL Intravenous Continuous Gatha Mayer, MD        ALLERGIES: Keflex [cephalexin] and Penicillins  Family History  Problem Relation Age of Onset  . Chronic bronchitis Mother   . Depression Mother   . Hypertension Father   . Hyperlipidemia Father   . Kidney disease Father   . Heart disease Father   . Hypertension Brother   . Hyperlipidemia Brother   . Colon cancer Neg Hx   . Esophageal cancer Neg Hx   . Stomach cancer Neg  Hx   . Rectal cancer Neg Hx     SH:  Separated, non smoker  Review of Systems  Constitutional: Negative for chills, fever and malaise/fatigue.  Respiratory: Negative.   Cardiovascular: Negative.   Genitourinary: Positive for dysuria, frequency and urgency. Negative for flank pain and hematuria.    PHYSICAL EXAMINATION:    There were no vitals taken for this visit.    General appearance: alert, cooperative and appears stated age CV:  RRR without M/R/G Resp:  CTA bilaterally Abdomen: soft, non-tender; bowel  sounds normal; no masses,  no organomegaly Flank:  No CVA tenderness  Assessment: Cystitis Possible recurrent UTIs  Plan: Will treat with Macrobid 100mg  bid x 5 days Repeat urine culture and micro is obtained.

## 2017-04-13 NOTE — Telephone Encounter (Signed)
Patient left voicemail overnight.  Says she was seen and treated for a UTI and thinks it might be back.

## 2017-04-13 NOTE — Telephone Encounter (Signed)
Spoke with patient. Patient states that she completed 3 days of Bactrim for UTI as directed. Patient is having recurring urinary frequency and dysuria. Denies lower back pain, fever, or chills. Advised will need to be seen in the office for further evaluation. Appointment scheduled for today at 3:15 pm with Dr.Miller. Patient is agreeable to date and time. Agreeable to see another MD as Dr.Silva if out of the office today.  Routing to covering provider for final review. Patient agreeable to disposition. Will close encounter.

## 2017-04-13 NOTE — Progress Notes (Deleted)
GYNECOLOGY  VISIT  CC:   ***  HPI: 51 y.o. G27P2002 Legally Separated Caucasian female here for urinary urgency and dysuria.  GYNECOLOGIC HISTORY: Patient's last menstrual period was 03/13/2017. Contraception: none Menopausal hormone therapy: none  Patient Active Problem List   Diagnosis Date Noted  . Depression   . IBS (irritable bowel syndrome) 03/01/2011  . Yeast in stool 11/15/2010  . ADENOCARCINOMA, BREAST 06/03/2010  . LIPOMAS, MULTIPLE 06/03/2010  . THYROID STIMULATING HORMONE, ABNORMAL 06/03/2010  . ALLERGIC RHINITIS 06/03/2010  . FINGER PAIN 06/03/2010  . DIARRHEA 06/03/2010    Past Medical History:  Diagnosis Date  . ADENOCARCINOMA, BREAST dx 04/2003   lumpectomy, B mastectomy, no chemo  . ALLERGIC RHINITIS   . Breast cancer ([place]) 2005   right breast--in [place]  . IBS (irritable bowel syndrome)   . LIPOMAS, MULTIPLE   . THYROID STIMULATING HORMONE, ABNORMAL 2012   hyperthyroidism- no medication needed    Past Surgical History:  Procedure Laterality Date  . BREAST BIOPSY  04/2003  . BREAST LUMPECTOMY  05/2003  . BREAST RECONSTRUCTION    . CESAREAN SECTION  2003  . MASTECTOMY  05/2003   Bilateral--Hx Rt.breast cancer--in [place]  . ORIF WRIST FRACTURE  01/07/2012   Procedure: OPEN REDUCTION INTERNAL FIXATION (ORIF) WRIST FRACTURE;  Surgeon: Roseanne Kaufman, MD;  Location: [facility];  Service: Orthopedics;  Laterality: Left;  excision lipoma right forearm    MEDS:   Current Outpatient Medications on File Prior to Visit  Medication Sig Dispense Refill  . Cholecalciferol (VITAMIN D PO) Take 1 tablet by mouth daily.    . Coenzyme Q10 (CO Q 10 PO) Take 1 tablet by mouth daily.    . Multiple Vitamin (MULTIVITAMIN WITH MINERALS) TABS Take 1 tablet by mouth daily.    . Naltrexone POWD 4.5 mg by Does not apply route at bedtime. 30 Bottle 6  . RESTASIS 0.05 % ophthalmic emulsion Place 1 drop into both eyes daily.  1   Current Facility-Administered Medications on File  Prior to Visit  Medication Dose Route Frequency Provider Last Rate Last Dose  . 0.9 %  sodium chloride infusion  500 mL Intravenous Continuous Gatha Mayer, MD        ALLERGIES: Keflex [cephalexin] and Penicillins  Family History  Problem Relation Age of Onset  . Chronic bronchitis Mother   . Depression Mother   . Hypertension Father   . Hyperlipidemia Father   . Kidney disease Father   . Heart disease Father   . Hypertension Brother   . Hyperlipidemia Brother   . Colon cancer Neg Hx   . Esophageal cancer Neg Hx   . Stomach cancer Neg Hx   . Rectal cancer Neg Hx     SH:  ***  ROS  PHYSICAL EXAMINATION:    BP 110/70 (BP Location: Right Arm, Patient Position: Sitting, Cuff Size: Normal)   Pulse 68   Temp 98.1 F (36.7 C) (Oral)   Resp 16   Ht 5' 9.25" (1.759 m)   Wt 142 lb (64.4 kg)   LMP 03/13/2017   BMI 20.82 kg/m     General appearance: alert, cooperative and appears stated age Neck: no adenopathy, supple, symmetrical, trachea midline and thyroid {CHL AMB PHY EX THYROID NORM DEFAULT:[phone]::"normal to inspection and palpation"} CV:  {Exam; heart brief:31539} Lungs:  {pe lungs ob:314451::"clear to auscultation, no wheezes, rales or rhonchi, symmetric air entry"} Breasts: {Exam; breast:13139::"normal appearance, no masses or tenderness"} Abdomen: soft, non-tender; bowel sounds normal; no  masses,  no organomegaly  Pelvic: External genitalia:  no lesions              Urethra:  normal appearing urethra with no masses, tenderness or lesions              Bartholins and Skenes: normal                 Vagina: normal appearing vagina with normal color and discharge, no lesions              Cervix: {CHL AMB PHY EX CERVIX NORM DEFAULT:[phone]::"no lesions"}              Bimanual Exam:  Uterus:  {CHL AMB PHY EX UTERUS NORM DEFAULT:[phone]::"normal size, contour, position, consistency, mobility, non-tender"}              Adnexa: {CHL AMB PHY EX ADNEXA NO MASS  DEFAULT:[phone]::"no mass, fullness, tenderness"}              Rectovaginal: {yes no:314532}.  Confirms.              Anus:  normal sphincter tone, no lesions  Chaperone was present for exam.  Assessment: ***  Plan: ***   ~{NUMBERS; -10-45 JOINT ROM:10287} minutes spent with patient >50% of time was in face to face discussion of above.

## 2017-04-14 LAB — URINALYSIS, MICROSCOPIC ONLY
Casts: NONE SEEN /lpf
WBC, UA: >30 /hpf — AB (ref 0–?)

## 2017-04-15 ENCOUNTER — Encounter: Payer: Self-pay | Admitting: Obstetrics & Gynecology

## 2017-04-15 LAB — URINE CULTURE

## 2017-05-05 ENCOUNTER — Telehealth: Payer: Self-pay | Admitting: Obstetrics and Gynecology

## 2017-05-05 NOTE — Telephone Encounter (Signed)
Patient states she thinks her UTI has come back for the 3rd time.  Patient requesting refill on antibiotic.  Patient aware we do not treat over the phone.

## 2017-05-05 NOTE — Telephone Encounter (Signed)
Left message to call Sid Greener at [phone].  

## 2017-05-10 NOTE — Telephone Encounter (Signed)
Message left to return call to Triage Nurse at [phone].    

## 2017-05-10 NOTE — Telephone Encounter (Signed)
Patient called after hours and left messages returning the nurses call.

## 2017-05-11 ENCOUNTER — Other Ambulatory Visit: Payer: Self-pay | Admitting: Obstetrics & Gynecology

## 2017-05-11 ENCOUNTER — Ambulatory Visit: Payer: Self-pay | Admitting: Obstetrics & Gynecology

## 2017-05-11 MED ORDER — SULFAMETHOXAZOLE-TRIMETHOPRIM 800-160 MG PO TABS: 1.0000 | ORAL_TABLET | Freq: Two times a day (BID) | ORAL | 0 refills | Status: DC

## 2017-05-11 NOTE — Telephone Encounter (Signed)
Spoke with patient. Reports recurrent UTIs, last treated on 11/29 with macrobid, symptoms resolved. Reports symptoms returned, increased fluids and symptoms resolved again.  Now c/o right sided lower back pain started 3 days ago after increasing fluids. Patient states she is unsure if r/t UTI symptoms or from working out at the gym.   Denies urinary symptoms, fever/chills, N/V.   Recommended OV for further evaluation, OV scheduled for today at 3pm with Dr. Sabra Heck. Patient is agreeable to date and time.   Routing to provider for final review. Patient is agreeable to disposition. Will close encounter.

## 2017-05-11 NOTE — Progress Notes (Signed)
Order placed for bactrim ds bid x 5 days due to no electricity in office and needing to close early.

## 2017-05-12 ENCOUNTER — Telehealth: Payer: Self-pay | Admitting: Obstetrics & Gynecology

## 2017-05-12 NOTE — Telephone Encounter (Signed)
Left voicemail for patient to reschedule UTI appt from 05/11/17.

## 2017-05-23 DIAGNOSIS — M9903 Segmental and somatic dysfunction of lumbar region: Secondary | ICD-10-CM | POA: Diagnosis not present

## 2017-05-23 DIAGNOSIS — Q72811 Congenital shortening of right lower limb: Secondary | ICD-10-CM | POA: Diagnosis not present

## 2017-05-23 DIAGNOSIS — M5386 Other specified dorsopathies, lumbar region: Secondary | ICD-10-CM | POA: Diagnosis not present

## 2017-05-23 DIAGNOSIS — Q72812 Congenital shortening of left lower limb: Secondary | ICD-10-CM | POA: Diagnosis not present

## 2017-05-23 DIAGNOSIS — M9904 Segmental and somatic dysfunction of sacral region: Secondary | ICD-10-CM | POA: Diagnosis not present

## 2017-05-23 DIAGNOSIS — M25551 Pain in right hip: Secondary | ICD-10-CM | POA: Diagnosis not present

## 2017-05-23 DIAGNOSIS — M62830 Muscle spasm of back: Secondary | ICD-10-CM | POA: Diagnosis not present

## 2017-05-23 DIAGNOSIS — M9905 Segmental and somatic dysfunction of pelvic region: Secondary | ICD-10-CM | POA: Diagnosis not present

## 2017-05-25 DIAGNOSIS — M9904 Segmental and somatic dysfunction of sacral region: Secondary | ICD-10-CM | POA: Diagnosis not present

## 2017-05-25 DIAGNOSIS — Q72812 Congenital shortening of left lower limb: Secondary | ICD-10-CM | POA: Diagnosis not present

## 2017-05-25 DIAGNOSIS — Q72811 Congenital shortening of right lower limb: Secondary | ICD-10-CM | POA: Diagnosis not present

## 2017-05-25 DIAGNOSIS — M62830 Muscle spasm of back: Secondary | ICD-10-CM | POA: Diagnosis not present

## 2017-05-25 DIAGNOSIS — M25551 Pain in right hip: Secondary | ICD-10-CM | POA: Diagnosis not present

## 2017-05-25 DIAGNOSIS — M5386 Other specified dorsopathies, lumbar region: Secondary | ICD-10-CM | POA: Diagnosis not present

## 2017-05-25 DIAGNOSIS — M9903 Segmental and somatic dysfunction of lumbar region: Secondary | ICD-10-CM | POA: Diagnosis not present

## 2017-05-25 DIAGNOSIS — M9905 Segmental and somatic dysfunction of pelvic region: Secondary | ICD-10-CM | POA: Diagnosis not present

## 2017-05-29 DIAGNOSIS — M25551 Pain in right hip: Secondary | ICD-10-CM | POA: Diagnosis not present

## 2017-05-29 DIAGNOSIS — M5386 Other specified dorsopathies, lumbar region: Secondary | ICD-10-CM | POA: Diagnosis not present

## 2017-05-29 DIAGNOSIS — M9903 Segmental and somatic dysfunction of lumbar region: Secondary | ICD-10-CM | POA: Diagnosis not present

## 2017-05-29 DIAGNOSIS — M9905 Segmental and somatic dysfunction of pelvic region: Secondary | ICD-10-CM | POA: Diagnosis not present

## 2017-07-14 DIAGNOSIS — E559 Vitamin D deficiency, unspecified: Secondary | ICD-10-CM | POA: Diagnosis not present

## 2017-07-14 DIAGNOSIS — E7211 Homocystinuria: Secondary | ICD-10-CM | POA: Diagnosis not present

## 2017-07-14 DIAGNOSIS — D8989 Other specified disorders involving the immune mechanism, not elsewhere classified: Secondary | ICD-10-CM | POA: Diagnosis not present

## 2017-07-14 DIAGNOSIS — R7982 Elevated C-reactive protein (CRP): Secondary | ICD-10-CM | POA: Diagnosis not present

## 2017-07-14 DIAGNOSIS — N951 Menopausal and female climacteric states: Secondary | ICD-10-CM | POA: Diagnosis not present

## 2017-07-14 DIAGNOSIS — R5382 Chronic fatigue, unspecified: Secondary | ICD-10-CM | POA: Diagnosis not present

## 2017-07-14 DIAGNOSIS — Z79890 Hormone replacement therapy (postmenopausal): Secondary | ICD-10-CM | POA: Diagnosis not present

## 2017-07-14 DIAGNOSIS — E039 Hypothyroidism, unspecified: Secondary | ICD-10-CM | POA: Diagnosis not present

## 2017-09-29 DIAGNOSIS — N951 Menopausal and female climacteric states: Secondary | ICD-10-CM | POA: Diagnosis not present

## 2017-09-29 DIAGNOSIS — Z79890 Hormone replacement therapy (postmenopausal): Secondary | ICD-10-CM | POA: Diagnosis not present

## 2017-10-24 ENCOUNTER — Ambulatory Visit (INDEPENDENT_AMBULATORY_CARE_PROVIDER_SITE_OTHER): Payer: 59 | Admitting: Psychology

## 2017-10-24 DIAGNOSIS — F330 Major depressive disorder, recurrent, mild: Secondary | ICD-10-CM

## 2018-01-03 DIAGNOSIS — S0502XA Injury of conjunctiva and corneal abrasion without foreign body, left eye, initial encounter: Secondary | ICD-10-CM | POA: Diagnosis not present

## 2018-01-04 DIAGNOSIS — S0502XS Injury of conjunctiva and corneal abrasion without foreign body, left eye, sequela: Secondary | ICD-10-CM | POA: Diagnosis not present

## 2018-01-04 DIAGNOSIS — S0502XD Injury of conjunctiva and corneal abrasion without foreign body, left eye, subsequent encounter: Secondary | ICD-10-CM | POA: Diagnosis not present

## 2018-01-05 DIAGNOSIS — S0502XD Injury of conjunctiva and corneal abrasion without foreign body, left eye, subsequent encounter: Secondary | ICD-10-CM | POA: Diagnosis not present

## 2018-02-16 DIAGNOSIS — H5203 Hypermetropia, bilateral: Secondary | ICD-10-CM | POA: Diagnosis not present

## 2018-02-16 DIAGNOSIS — H52223 Regular astigmatism, bilateral: Secondary | ICD-10-CM | POA: Diagnosis not present

## 2018-02-16 DIAGNOSIS — H524 Presbyopia: Secondary | ICD-10-CM | POA: Diagnosis not present

## 2018-02-27 ENCOUNTER — Ambulatory Visit (INDEPENDENT_AMBULATORY_CARE_PROVIDER_SITE_OTHER): Payer: 59 | Admitting: Psychology

## 2018-02-27 DIAGNOSIS — F330 Major depressive disorder, recurrent, mild: Secondary | ICD-10-CM

## 2018-03-26 DIAGNOSIS — Z853 Personal history of malignant neoplasm of breast: Secondary | ICD-10-CM | POA: Diagnosis not present

## 2018-03-26 DIAGNOSIS — C50919 Malignant neoplasm of unspecified site of unspecified female breast: Secondary | ICD-10-CM | POA: Diagnosis not present

## 2018-03-26 DIAGNOSIS — M533 Sacrococcygeal disorders, not elsewhere classified: Secondary | ICD-10-CM | POA: Diagnosis not present

## 2018-03-28 ENCOUNTER — Ambulatory Visit (INDEPENDENT_AMBULATORY_CARE_PROVIDER_SITE_OTHER): Payer: 59 | Admitting: Psychology

## 2018-03-28 DIAGNOSIS — F330 Major depressive disorder, recurrent, mild: Secondary | ICD-10-CM | POA: Diagnosis not present

## 2018-03-29 DIAGNOSIS — M810 Age-related osteoporosis without current pathological fracture: Secondary | ICD-10-CM | POA: Diagnosis not present

## 2018-03-29 DIAGNOSIS — E039 Hypothyroidism, unspecified: Secondary | ICD-10-CM | POA: Diagnosis not present

## 2018-03-29 DIAGNOSIS — N951 Menopausal and female climacteric states: Secondary | ICD-10-CM | POA: Diagnosis not present

## 2018-03-29 DIAGNOSIS — Z79890 Hormone replacement therapy (postmenopausal): Secondary | ICD-10-CM | POA: Diagnosis not present

## 2018-05-29 ENCOUNTER — Ambulatory Visit: Payer: 59 | Admitting: Psychology

## 2018-05-31 DIAGNOSIS — E559 Vitamin D deficiency, unspecified: Secondary | ICD-10-CM | POA: Diagnosis not present

## 2018-05-31 DIAGNOSIS — Z853 Personal history of malignant neoplasm of breast: Secondary | ICD-10-CM | POA: Diagnosis not present

## 2018-05-31 DIAGNOSIS — R7982 Elevated C-reactive protein (CRP): Secondary | ICD-10-CM | POA: Diagnosis not present

## 2018-05-31 DIAGNOSIS — D899 Disorder involving the immune mechanism, unspecified: Secondary | ICD-10-CM | POA: Diagnosis not present

## 2018-05-31 DIAGNOSIS — D8949 Other mast cell activation disorder: Secondary | ICD-10-CM | POA: Diagnosis not present

## 2018-05-31 DIAGNOSIS — E610 Copper deficiency: Secondary | ICD-10-CM | POA: Diagnosis not present

## 2018-05-31 DIAGNOSIS — E069 Thyroiditis, unspecified: Secondary | ICD-10-CM | POA: Diagnosis not present

## 2018-07-16 DIAGNOSIS — Z79890 Hormone replacement therapy (postmenopausal): Secondary | ICD-10-CM | POA: Diagnosis not present

## 2018-07-16 DIAGNOSIS — M810 Age-related osteoporosis without current pathological fracture: Secondary | ICD-10-CM | POA: Diagnosis not present

## 2018-07-16 DIAGNOSIS — N951 Menopausal and female climacteric states: Secondary | ICD-10-CM | POA: Diagnosis not present

## 2018-07-16 DIAGNOSIS — R5383 Other fatigue: Secondary | ICD-10-CM | POA: Diagnosis not present

## 2018-09-21 DIAGNOSIS — M810 Age-related osteoporosis without current pathological fracture: Secondary | ICD-10-CM | POA: Diagnosis not present

## 2018-09-21 DIAGNOSIS — Z79890 Hormone replacement therapy (postmenopausal): Secondary | ICD-10-CM | POA: Diagnosis not present

## 2018-09-21 DIAGNOSIS — N951 Menopausal and female climacteric states: Secondary | ICD-10-CM | POA: Diagnosis not present

## 2018-10-25 DIAGNOSIS — S0501XA Injury of conjunctiva and corneal abrasion without foreign body, right eye, initial encounter: Secondary | ICD-10-CM | POA: Diagnosis not present

## 2018-10-26 DIAGNOSIS — S0501XD Injury of conjunctiva and corneal abrasion without foreign body, right eye, subsequent encounter: Secondary | ICD-10-CM | POA: Diagnosis not present

## 2018-10-30 DIAGNOSIS — S0501XA Injury of conjunctiva and corneal abrasion without foreign body, right eye, initial encounter: Secondary | ICD-10-CM | POA: Diagnosis not present

## 2018-12-05 ENCOUNTER — Ambulatory Visit (INDEPENDENT_AMBULATORY_CARE_PROVIDER_SITE_OTHER): Payer: 59 | Admitting: Psychology

## 2018-12-05 DIAGNOSIS — F330 Major depressive disorder, recurrent, mild: Secondary | ICD-10-CM | POA: Diagnosis not present

## 2018-12-19 ENCOUNTER — Ambulatory Visit: Payer: 59 | Admitting: Psychology

## 2019-02-26 DIAGNOSIS — Z79890 Hormone replacement therapy (postmenopausal): Secondary | ICD-10-CM | POA: Diagnosis not present

## 2019-02-26 DIAGNOSIS — R5383 Other fatigue: Secondary | ICD-10-CM | POA: Diagnosis not present

## 2019-02-26 DIAGNOSIS — M810 Age-related osteoporosis without current pathological fracture: Secondary | ICD-10-CM | POA: Diagnosis not present

## 2019-03-12 ENCOUNTER — Other Ambulatory Visit (HOSPITAL_COMMUNITY)
Admission: RE | Admit: 2019-03-12 | Discharge: 2019-03-12 | Disposition: A | Discharge status: Home / Self Care | Payer: 59 | Source: Ambulatory Visit | Attending: Obstetrics and Gynecology | Admitting: Obstetrics and Gynecology

## 2019-03-12 ENCOUNTER — Encounter: Payer: Self-pay | Admitting: Obstetrics and Gynecology

## 2019-03-12 ENCOUNTER — Other Ambulatory Visit: Payer: Self-pay

## 2019-03-12 ENCOUNTER — Ambulatory Visit (INDEPENDENT_AMBULATORY_CARE_PROVIDER_SITE_OTHER): Payer: 59 | Admitting: Obstetrics and Gynecology

## 2019-03-12 VITALS — BP 122/74 | HR 84 | Temp 97.6°F | Resp 16 | Ht 69.5 in | Wt 139.4 lb

## 2019-03-12 DIAGNOSIS — Z01419 Encounter for gynecological examination (general) (routine) without abnormal findings: Secondary | ICD-10-CM

## 2019-03-12 NOTE — Progress Notes (Signed)
53 y.o. G52P2002 Divorced Caucasian female here for annual exam.    No vaginal bleeding.   Doing testosterone pellet injection every 4 months, aromatase inhibitor.  Taking Prometrium orally.  No hot flashes.   Engaged.  May move to [place].   PCP:  Leeroy Cha, MD   Patient's last menstrual period was 05/16/2016 (approximate).           Sexually active: Yes.    The current method of family planning is post menopausal status.    Exercising: Yes.    walking, tennis and yoga Smoker:  no  Health Maintenance: Pap: 03-07-16 Neg:Neg HR HPV, 01-21-15 Neg:Neg HR HPV History of abnormal Pap:  Yes, 2014 Neg Pap but thinks she had Pos.HR HPV--no colposcopy and no treatment. MMG: Bilateral mastectomy due to Rt.sided breast cancer in 2005 in [place]. Colonoscopy: 05-12-16 normal:05-12-16 normal  BMD: 07/2013  Result:Normal(pt. States she had DEXA done 2005 prior to treatment for Breast CA and was told she was Osteopenic). TDaP: DECLINES Gardasil:   n/a HIV:01-21-15 NR Hep C:01-21-15 Neg Screening Labs:  Today.  Flu vaccine:  Completed.    reports that she has never smoked. She has never used smokeless tobacco. She reports current alcohol use of about 5.0 standard drinks of alcohol per week. She reports that she does not use drugs.  Past Medical History:  Diagnosis Date  . ADENOCARCINOMA, BREAST dx 04/2003   lumpectomy, B mastectomy, no chemo  . ALLERGIC RHINITIS   . Breast cancer ([place]) 2005   right breast--in [place]  . IBS (irritable bowel syndrome)   . LIPOMAS, MULTIPLE   . THYROID STIMULATING HORMONE, ABNORMAL 2012   hyperthyroidism- no medication needed    Past Surgical History:  Procedure Laterality Date  . BREAST BIOPSY  04/2003  . BREAST LUMPECTOMY  05/2003  . BREAST RECONSTRUCTION    . CESAREAN SECTION  2003  . MASTECTOMY  05/2003   Bilateral--Hx Rt.breast cancer--in [place]  . ORIF WRIST FRACTURE  01/07/2012   Procedure: OPEN REDUCTION INTERNAL FIXATION (ORIF) WRIST  FRACTURE;  Surgeon: Roseanne Kaufman, MD;  Location: [place];  Service: Orthopedics;  Laterality: Left;  excision lipoma right forearm    Current Outpatient Medications  Medication Sig Dispense Refill  . Cholecalciferol (VITAMIN D PO) Take 1 tablet by mouth daily.    . Coenzyme Q10 (CO Q 10 PO) Take 1 tablet by mouth daily.    . Multiple Vitamin (MULTIVITAMIN WITH MINERALS) TABS Take 1 tablet by mouth daily.    . Naltrexone POWD 4.5 mg by Does not apply route at bedtime. 30 Bottle 6  . OVER THE COUNTER MEDICATION DIM Takes '250mg'$  daily    . PRESCRIPTION MEDICATION Testosterone pelet every 4 months    . progesterone (PROMETRIUM) 100 MG capsule Take 100 mg by mouth daily.    . RESTASIS 0.05 % ophthalmic emulsion Place 1 drop into both eyes daily.  1  . scopolamine (TRANSDERM-SCOP) 1 MG/3DAYS PLACE 1 PATCH BEHIND EAR 4 HOURS BEFORE TRAVEL AND CAN REPLACE Q 3 DAYS UTD     Current Facility-Administered Medications  Medication Dose Route Frequency Provider Last Rate Last Dose  . 0.9 %  sodium chloride infusion  500 mL Intravenous Continuous Gatha Mayer, MD        Family History  Problem Relation Age of Onset  . Chronic bronchitis Mother   . Depression Mother   . Hypertension Father   . Hyperlipidemia Father   . Kidney disease Father   . Heart disease  Father   . Hypertension Brother   . Hyperlipidemia Brother   . Colon cancer Neg Hx   . Esophageal cancer Neg Hx   . Stomach cancer Neg Hx   . Rectal cancer Neg Hx     Review of Systems  All other systems reviewed and are negative.   Exam:   BP 122/74   Pulse 84   Temp 97.6 F (36.4 C) (Temporal)   Resp 16   Ht 5' 9.5" (1.765 m)   Wt 139 lb 6.4 oz (63.2 kg)   LMP 05/16/2016 (Approximate)   BMI 20.29 kg/m     General appearance: alert, cooperative and appears stated age Head: normocephalic, without obvious abnormality, atraumatic Neck: no adenopathy, supple, symmetrical, trachea midline and thyroid normal to inspection and  palpation Lungs: clear to auscultation bilaterally Breasts: absent.  Bilateral implants.  No axillary nodes palpable. Heart: regular rate and rhythm Abdomen: soft, non-tender; no masses, no organomegaly Extremities: extremities normal, atraumatic, no cyanosis or edema Skin: skin color, texture, turgor normal. No rashes or lesions Lymph nodes: cervical, supraclavicular, and axillary nodes normal. Neurologic: grossly normal  Pelvic: External genitalia:  no lesions              No abnormal inguinal nodes palpated.              Urethra:  normal appearing urethra with no masses, tenderness or lesions              Bartholins and Skenes: normal                 Vagina: normal appearing vagina with normal color and discharge, no lesions              Cervix: no lesions              Pap taken: Yes.   Bimanual Exam:  Uterus:  normal size, contour, position, consistency, mobility, non-tender              Adnexa: no mass, fullness, tenderness              Rectal exam: Yes.  .  Confirms.              Anus:  normal sphincter tone, no lesions  Chaperone was present for exam.  Assessment:   Well woman visit with normal exam. Hx right breast cancer. BRCA negative.  Status post bilateral mastectomy.  Hx of abnormal pap/positive HR HPV. No tx.  Doing hormone therapy with testosterone, progesterone, and aromatase inhibitor. Hx osteopenia dx in 2005.   FU BMD normal.  Hyperthyroidism.  No treatment.   Plan: Mammogram screening discussed. Self breast awareness reviewed. Pap and HR HPV as above. Guidelines for Calcium, Vitamin D, regular exercise program including cardiovascular and weight bearing exercise. Routine labs.  Will consider endocrinology visit.  Follow up annually and prn.  After visit summary provided.

## 2019-03-12 NOTE — Patient Instructions (Signed)

## 2019-03-13 ENCOUNTER — Encounter: Payer: Self-pay | Admitting: Obstetrics and Gynecology

## 2019-03-13 ENCOUNTER — Telehealth: Payer: Self-pay

## 2019-03-13 DIAGNOSIS — E0590 Thyrotoxicosis, unspecified without thyrotoxic crisis or storm: Secondary | ICD-10-CM

## 2019-03-13 LAB — CBC
Hematocrit: 40.1 % (ref 34.0–46.6)
Hemoglobin: 13 g/dL (ref 11.1–15.9)
MCH: 29.3 pg (ref 26.6–33.0)
MCHC: 32.4 g/dL (ref 31.5–35.7)
MCV: 91 fL (ref 79–97)
Platelets: 259 10*3/uL (ref 150–450)
RBC: 4.43 x10E6/uL (ref 3.77–5.28)
RDW: 13 % (ref 11.7–15.4)
WBC: 6.5 10*3/uL (ref 3.4–10.8)

## 2019-03-13 LAB — COMPREHENSIVE METABOLIC PANEL
ALT: 11 IU/L (ref 0–32)
AST: 14 IU/L (ref 0–40)
Albumin/Globulin Ratio: 1.5 (ref 1.2–2.2)
Albumin: 4.3 g/dL (ref 3.8–4.9)
Alkaline Phosphatase: 80 IU/L (ref 39–117)
BUN/Creatinine Ratio: 19 (ref 9–23)
BUN: 14 mg/dL (ref 6–24)
Bilirubin Total: 0.3 mg/dL (ref 0.0–1.2)
CO2: 25 mmol/L (ref 20–29)
Calcium: 9.9 mg/dL (ref 8.7–10.2)
Chloride: 102 mmol/L (ref 96–106)
Creatinine, Ser: 0.72 mg/dL (ref 0.57–1.00)
GFR calc Af Amer: 111 mL/min/{1.73_m2} (ref >59)
GFR calc non Af Amer: 97 mL/min/{1.73_m2} (ref >59)
Globulin, Total: 2.9 g/dL (ref 1.5–4.5)
Glucose: 98 mg/dL (ref 65–99)
Potassium: 4.1 mmol/L (ref 3.5–5.2)
Sodium: 139 mmol/L (ref 134–144)
Total Protein: 7.2 g/dL (ref 6.0–8.5)

## 2019-03-13 LAB — LIPID PANEL
Chol/HDL Ratio: 2.4 ratio (ref 0.0–4.4)
Cholesterol, Total: 194 mg/dL (ref 100–199)
HDL: 81 mg/dL (ref >39)
LDL Chol Calc (NIH): 95 mg/dL (ref 0–99)
Triglycerides: 103 mg/dL (ref 0–149)
VLDL Cholesterol Cal: 18 mg/dL (ref 5–40)

## 2019-03-13 LAB — T3, FREE: T3, Free: 5.1 pg/mL — ABNORMAL HIGH (ref 2.0–4.4)

## 2019-03-13 LAB — TSH: TSH: <0.005 u[IU]/mL — ABNORMAL LOW (ref 0.450–4.500)

## 2019-03-13 LAB — T4, FREE: Free T4: 1.9 ng/dL — ABNORMAL HIGH (ref 0.82–1.77)

## 2019-03-13 NOTE — Telephone Encounter (Signed)
Spoke with pt. Pt notified of lab results. Pt agreeable to see within [facility] for Endocrinology. Dr Kelton Pillar at [place] was referred from Dr Quincy Simmonds and pt agreed to selection. Pt unsure about getting referral. Will refer to [place] and [place]. Pt verbalized understanding.   Will route to Dr Quincy Simmonds for review.  CC: Weston Brass  Encounter Closed.

## 2019-03-13 NOTE — Addendum Note (Signed)
Addended by: Georgia Lopes on: 03/13/2019 04:11 PM   Modules accepted: Orders

## 2019-03-18 LAB — CYTOLOGY - PAP
Comment: NEGATIVE
Diagnosis: NEGATIVE
High risk HPV: NEGATIVE

## 2019-03-21 ENCOUNTER — Other Ambulatory Visit: Payer: Self-pay

## 2019-03-21 ENCOUNTER — Encounter: Payer: Self-pay | Admitting: Internal Medicine

## 2019-03-21 ENCOUNTER — Ambulatory Visit: Payer: 59 | Admitting: Internal Medicine

## 2019-03-21 VITALS — BP 118/72 | HR 72 | Temp 98.2°F | Ht 69.5 in | Wt 143.4 lb

## 2019-03-21 DIAGNOSIS — E0590 Thyrotoxicosis, unspecified without thyrotoxic crisis or storm: Secondary | ICD-10-CM

## 2019-03-21 LAB — TSH: TSH: <0.01 u[IU]/mL — ABNORMAL LOW (ref 0.35–4.50)

## 2019-03-21 LAB — T4, FREE: Free T4: 1.34 ng/dL (ref 0.60–1.60)

## 2019-03-21 NOTE — Patient Instructions (Addendum)
-   Please stop by the lab today [phone]

## 2019-03-21 NOTE — Progress Notes (Signed)
Name: Barbara Ingram  MRN/ DOB: [id number], [id number]    Age/ Sex: 53 y.o., female    PCP: Leeroy Cha, MD   Reason for Endocrinology Evaluation: Hyperthyroidism     Date of Initial Endocrinology Evaluation: 03/22/2019     HPI: Barbara Ingram is a 53 y.o. female with a past medical history of IBS and hx of breast ca ( S/P mastectomy , no radiation or therapy). The patient presented for initial endocrinology clinic visit on 03/22/2019 for consultative assistance with her hyperthyroidism  Pt was noted with low TSh and elevated T4 during routine lab work in 02/2019.  In review of her records pt was noted to have a suppressed TSH in 05/2010 with normal FT4 at the time. Pt did have a thyroid uptake and scan in 2018 which showed very low 24 hour I 131 uptake = 1.4% (normal 10-30%)  Sees  alternative medical doctor Gaynelle Cage) , also uses testosterone pellet laced with estrogen  DIM, vitamin D, MSM  Also Takes iodine capsule    Denies weight loss, heat intolerance, palpitations Has occasional loose stools Denies anxiety , jittery sensation or anxiety.   No viral infections recently  Denies local neck symptoms  No prior hx of osteoporosis or cardiac arrhythmia   Mother with hx of goiter and hypothyroidism   Works in healthcare administration     HISTORY:  Past Medical History:  Past Medical History:  Diagnosis Date  . ADENOCARCINOMA, BREAST dx 04/2003   lumpectomy, B mastectomy, no chemo  . ALLERGIC RHINITIS   . Breast cancer ([place]) 2005   right breast--in [place]  . IBS (irritable bowel syndrome)   . LIPOMAS, MULTIPLE   . THYROID STIMULATING HORMONE, ABNORMAL 2012   hyperthyroidism- no medication needed   Past Surgical History:  Past Surgical History:  Procedure Laterality Date  . BREAST BIOPSY  04/2003  . BREAST LUMPECTOMY  05/2003  . BREAST RECONSTRUCTION    . CESAREAN SECTION  2003  . MASTECTOMY  05/2003   Bilateral--Hx Rt.breast cancer--in [place]  . ORIF  WRIST FRACTURE  01/07/2012   Procedure: OPEN REDUCTION INTERNAL FIXATION (ORIF) WRIST FRACTURE;  Surgeon: Roseanne Kaufman, MD;  Location: [place];  Service: Orthopedics;  Laterality: Left;  excision lipoma right forearm      Social History:  reports that she has never smoked. She has never used smokeless tobacco. She reports current alcohol use of about 5.0 standard drinks of alcohol per week. She reports that she does not use drugs.  Family History: family history includes Chronic bronchitis in her mother; Depression in her mother; Heart disease in her father; Hyperlipidemia in her brother and father; Hypertension in her brother and father; Kidney disease in her father.   HOME MEDICATIONS: Allergies as of 03/21/2019      Reactions   Keflex [cephalexin] Itching   hives   Penicillins Other (See Comments)   hives      Medication List       Accurate as of March 21, 2019 11:59 PM. If you have any questions, ask your nurse or doctor.        CO Q 10 PO Take 1 tablet by mouth daily.   multivitamin with minerals Tabs tablet Take 1 tablet by mouth daily.   Naltrexone Powd 4.5 mg by Does not apply route at bedtime.   OVER THE COUNTER MEDICATION DIM Takes 250mg  daily   PRESCRIPTION MEDICATION Testosterone pelet every 4 months   progesterone 100 MG capsule Commonly known  as: PROMETRIUM Take 100 mg by mouth daily.   Restasis 0.05 % ophthalmic emulsion Generic drug: cycloSPORINE Place 1 drop into both eyes daily.   scopolamine 1 MG/3DAYS Commonly known as: TRANSDERM-SCOP PLACE 1 PATCH BEHIND EAR 4 HOURS BEFORE TRAVEL AND CAN REPLACE Q 3 DAYS UTD   VITAMIN D PO Take 1 tablet by mouth daily.         REVIEW OF SYSTEMS: A comprehensive ROS was conducted with the patient and is negative except as per HPI and below:  Review of Systems  Constitutional: Negative for chills and fever.  HENT: Negative for congestion and sore throat.   Eyes: Negative for blurred vision and  double vision.  Respiratory: Negative for cough and shortness of breath.   Cardiovascular: Negative for chest pain and palpitations.  Gastrointestinal: Negative for diarrhea and nausea.  Psychiatric/Behavioral: Negative for depression. The patient is not nervous/anxious.        OBJECTIVE:  VS: BP 118/72 (BP Location: Left Arm, Patient Position: Sitting, Cuff Size: Normal)   Pulse 72   Temp 98.2 F (36.8 C) (Oral)   Ht 5' 9.5" (1.765 m)   Wt 143 lb 6.4 oz (65 kg)   LMP 03/13/2017   SpO2 99%   BMI 20.87 kg/m    Wt Readings from Last 3 Encounters:  03/21/19 143 lb 6.4 oz (65 kg)  03/12/19 139 lb 6.4 oz (63.2 kg)  04/13/17 142 lb (64.4 kg)     EXAM: General: Pt appears well and is in NAD  Hydration: Well-hydrated with moist mucous membranes and good skin turgor  Eyes: External eye exam normal without stare, lid lag or exophthalmos.  EOM intact.    Ears, Nose, Throat: Hearing: Grossly intact bilaterally Dental: Good dentition  Throat: Clear without mass, erythema or exudate  Neck: General: Supple without adenopathy. Thyroid: Thyroid size normal.  No goiter or nodules appreciated. No thyroid bruit.  Lungs: Clear with good BS bilat with no rales, rhonchi, or wheezes  Heart: Auscultation: RRR.  Abdomen: Normoactive bowel sounds, soft, nontender, without masses or organomegaly palpable  Extremities: Gait and station: Normal gait  Digits and nails: No clubbing, cyanosis, petechiae, or nodes Head and neck: Normal alignment and mobility BL UE: Normal ROM and strength. BL LE: No pretibial edema normal ROM and strength.  Skin: Hair: Texture and amount normal with gender appropriate distribution Skin Inspection: No rashes Skin Palpation: Skin temperature, texture, and thickness normal to palpation  Neuro: Cranial nerves: II - XII grossly intact  Motor: Normal strength throughout DTRs: 2+ and symmetric in UE without delay in relaxation phase  Mental Status: Judgment, insight:  Intact Orientation: Oriented to time, place, and person Mood and affect: No depression, anxiety, or agitation     DATA REVIEWED: Results for Barbara Ingram, Barbara Ingram (MRN [id number]) as of 03/22/2019 12:23  Ref. Range 03/21/2019 15:00  TSH Latest Ref Range: 0.35 - 4.50 uIU/mL <0.01 (L)  Triiodothyronine (T3) Latest Ref Range: 76 - 181 ng/dL 148  T4,Free(Direct) Latest Ref Range: 0.60 - 1.60 ng/dL 1.34  Thyroglobulin Latest Units: ng/mL 48.0 (H)     Results for Barbara Ingram, Barbara Ingram (MRN [id number]) as of 03/21/2019 12:56  Ref. Range 03/12/2019 16:05  TSH Latest Ref Range: 0.450 - 4.500 uIU/mL <0.005 (L)  Triiodothyronine,Free,Serum Latest Ref Range: 2.0 - 4.4 pg/mL 5.1 (H)  T4,Free(Direct) Latest Ref Range: 0.82 - 1.77 ng/dL 1.90 (H)   ASSESSMENT/PLAN/RECOMMENDATIONS:   1. Hyperthyroidism :   - Pt is clinically euthyroid  - The causes of subclinical  hyperthyroidism are the same as the causes of overt hyperthyroidism, and like overt hyperthyroidism, subclinical hyperthyroidism can be persistent or transient. Common causes of subclinical hyperthyroidism include excessive thyroid hormone therapy, autonomously functioning thyroid adenomas and multinodular goiters , or Graves' disease. Most patients with subclinical hyperthyroidism have no clinical manifestations of hyperthyroidism, and those symptoms that are present (eg, tachycardia, tremor, dyspnea on exertion, weight loss) are mild and nonspecific." However, subclinical hyperthyroidism is associated with an increased risk of atrial fibrillation and, a decrease in bone mineral density. - Elevated Tg excludes excludes exogenous thyroxine intake.  - With normalization of T4, T3, and no symptoms, age below 19 and no prior dx of osteoporosis or arrythmia we could hold off on treatment at this time but continue to monitor - She was advised to stop the Iodine , this could be the reason for abnormally low uptake scan results back in 2018 - Awaiting TRAb  Addendum:  discussed results with pt on 03/22/2019 at noon.   Signed electronically by: Mack Guise, MD  [facility] Endocrinology  [facility] [address] [place], [place] [address] Phone: [phone] FAX: [phone]   CC: Leeroy Cha, MD [address] [place] [address] Phone: [phone] Fax: [phone]   Return to Endocrinology clinic as below: Future Appointments  Date Time Provider [place]y [place]ne  06/25/2019  3:20 PM Shamleffer, Melanie Crazier, MD [id number] None

## 2019-03-22 ENCOUNTER — Encounter: Payer: Self-pay | Admitting: Internal Medicine

## 2019-03-22 DIAGNOSIS — E0590 Thyrotoxicosis, unspecified without thyrotoxic crisis or storm: Secondary | ICD-10-CM | POA: Insufficient documentation

## 2019-03-26 LAB — T3: T3, Total: 148 ng/dL (ref 76–181)

## 2019-03-26 LAB — THYROGLOBULIN LEVEL: Thyroglobulin: 48 ng/mL — ABNORMAL HIGH

## 2019-03-26 LAB — TRAB (TSH RECEPTOR BINDING ANTIBODY): TRAB: 9.64 IU/L — ABNORMAL HIGH (ref <=2.00)

## 2019-04-24 ENCOUNTER — Ambulatory Visit (INDEPENDENT_AMBULATORY_CARE_PROVIDER_SITE_OTHER): Payer: 59 | Admitting: Psychology

## 2019-04-24 DIAGNOSIS — F4323 Adjustment disorder with mixed anxiety and depressed mood: Secondary | ICD-10-CM

## 2019-05-01 DIAGNOSIS — F419 Anxiety disorder, unspecified: Secondary | ICD-10-CM | POA: Diagnosis not present

## 2019-05-02 ENCOUNTER — Ambulatory Visit (INDEPENDENT_AMBULATORY_CARE_PROVIDER_SITE_OTHER): Payer: 59 | Admitting: Psychology

## 2019-05-02 DIAGNOSIS — F4323 Adjustment disorder with mixed anxiety and depressed mood: Secondary | ICD-10-CM | POA: Diagnosis not present

## 2019-05-03 ENCOUNTER — Ambulatory Visit: Payer: 59 | Admitting: Psychology

## 2019-05-29 ENCOUNTER — Ambulatory Visit
Admission: RE | Admit: 2019-05-29 | Discharge: 2019-05-29 | Disposition: A | Discharge status: Home / Self Care | Payer: 59 | Source: Ambulatory Visit | Attending: Internal Medicine | Admitting: Internal Medicine

## 2019-05-29 ENCOUNTER — Other Ambulatory Visit: Payer: Self-pay | Admitting: Internal Medicine

## 2019-05-29 ENCOUNTER — Ambulatory Visit (INDEPENDENT_AMBULATORY_CARE_PROVIDER_SITE_OTHER): Payer: No Typology Code available for payment source | Admitting: Psychology

## 2019-05-29 DIAGNOSIS — M79671 Pain in right foot: Secondary | ICD-10-CM

## 2019-05-29 DIAGNOSIS — F4323 Adjustment disorder with mixed anxiety and depressed mood: Secondary | ICD-10-CM | POA: Diagnosis not present

## 2019-06-04 ENCOUNTER — Ambulatory Visit: Payer: No Typology Code available for payment source | Admitting: Podiatry

## 2019-06-04 ENCOUNTER — Other Ambulatory Visit: Payer: Self-pay

## 2019-06-04 DIAGNOSIS — M79671 Pain in right foot: Secondary | ICD-10-CM | POA: Diagnosis not present

## 2019-06-04 DIAGNOSIS — D3610 Benign neoplasm of peripheral nerves and autonomic nervous system, unspecified: Secondary | ICD-10-CM | POA: Diagnosis not present

## 2019-06-04 NOTE — Patient Instructions (Addendum)

## 2019-06-10 ENCOUNTER — Ambulatory Visit (INDEPENDENT_AMBULATORY_CARE_PROVIDER_SITE_OTHER): Payer: No Typology Code available for payment source | Admitting: Psychology

## 2019-06-10 DIAGNOSIS — F4323 Adjustment disorder with mixed anxiety and depressed mood: Secondary | ICD-10-CM

## 2019-06-10 NOTE — Progress Notes (Signed)
Subjective:   Patient ID: Barbara Ingram, female   DOB: 54 y.o.   MRN: [id number]   HPI 54 year old female presents the office today for pain to the right foot pointing to the ball of her foot which started after she was walking heels in October 2020.  She describes a burning pain to the area.  She is able to do activities but she states that she is frustrated she is having the discomfort.  She has tried changing shoes.  No recent injury or falls.   Review of Systems  All other systems reviewed and are negative.  Past Medical History:  Diagnosis Date  . ADENOCARCINOMA, BREAST dx 04/2003   lumpectomy, B mastectomy, no chemo  . ALLERGIC RHINITIS   . Breast cancer ([place]) 2005   right breast--in [place]  . IBS (irritable bowel syndrome)   . LIPOMAS, MULTIPLE   . THYROID STIMULATING HORMONE, ABNORMAL 2012   hyperthyroidism- no medication needed    Past Surgical History:  Procedure Laterality Date  . BREAST BIOPSY  04/2003  . BREAST LUMPECTOMY  05/2003  . BREAST RECONSTRUCTION    . CESAREAN SECTION  2003  . MASTECTOMY  05/2003   Bilateral--Hx Rt.breast cancer--in [place]  . ORIF WRIST FRACTURE  01/07/2012   Procedure: OPEN REDUCTION INTERNAL FIXATION (ORIF) WRIST FRACTURE;  Surgeon: Roseanne Kaufman, MD;  Location: [place];  Service: Orthopedics;  Laterality: Left;  excision lipoma right forearm     Current Outpatient Medications:  .  ascorbic acid (VITAMIN C) 1000 MG tablet, Take by mouth., Disp: , Rfl:  .  Cholecalciferol (VITAMIN D PO), Take 1 tablet by mouth daily., Disp: , Rfl:  .  Coenzyme Q10 (CO Q 10 PO), Take 1 tablet by mouth daily., Disp: , Rfl:  .  meloxicam (MOBIC) 7.5 MG tablet, Take 7.5 mg by mouth daily as needed., Disp: , Rfl:  .  Multiple Vitamin (MULTIVITAMIN WITH MINERALS) TABS, Take 1 tablet by mouth daily., Disp: , Rfl:  .  Naltrexone POWD, 4.5 mg by Does not apply route at bedtime., Disp: 30 Bottle, Rfl: 6 .  OVER THE COUNTER MEDICATION, DIM Takes 250mg  daily, Disp: ,  Rfl:  .  PRESCRIPTION MEDICATION, Testosterone pelet every 4 months, Disp: , Rfl:  .  progesterone (PROMETRIUM) 100 MG capsule, Take 100 mg by mouth daily., Disp: , Rfl:  .  RESTASIS 0.05 % ophthalmic emulsion, Place 1 drop into both eyes daily., Disp: , Rfl: 1 .  scopolamine (TRANSDERM-SCOP) 1 MG/3DAYS, PLACE 1 PATCH BEHIND EAR 4 HOURS BEFORE TRAVEL AND CAN REPLACE Q 3 DAYS UTD, Disp: , Rfl:   Current Facility-Administered Medications:  .  0.9 %  sodium chloride infusion, 500 mL, Intravenous, Continuous, Gatha Mayer, MD  Allergies  Allergen Reactions  . Keflex [Cephalexin] Itching    hives  . Penicillins Other (See Comments)    hives        Objective:  Physical Exam  General: AAO x3, NAD  Dermatological: Skin is warm, dry and supple bilateral. Nails x 10 are well manicured; remaining integument appears unremarkable at this time. There are no open sores, no preulcerative lesions, no rash or signs of infection present.  Vascular: Dorsalis Pedis artery and Posterior Tibial artery pedal pulses are 2/4 bilateral with immedate capillary fill time. Pedal hair growth present. No varicosities and no lower extremity edema present bilateral. There is no pain with calf compression, swelling, warmth, erythema.   Neruologic: Grossly intact via light touch bilateral. Vibratory intact via tuning  fork bilateral.   Musculoskeletal: There is tenderness of the second interspace of the right foot and there is a palpable Mulder's click present.  There is no area of pinpoint tenderness.  This is localized to the area where she gets the burning pain as well.  Minimal edema.  No erythema or warmth.  Muscular strength 5/5 in all groups tested bilateral.  Gait: Unassisted, Nonantalgic.       Assessment:   Right foot neuroma     Plan:  -Treatment options discussed including all alternatives, risks, and complications -Etiology of symptoms were discussed -She previous x-rays performed which were  negative. -Steroid injection performed.  See procedure note below.  Neuroma pads dispensed.  Discussed arch support.  Procedure: Injection Morton's Neuroma Discussed alternatives, risks, complications and verbal consent was obtained.  Location: Right foot interspace  Skin Prep: Alcohol. Injectate: 0.5cc 0.5% marcaine plain, 0.5 cc 2% lidocaine plain and, 1 cc kenalog 10. Disposition: Patient tolerated procedure well. Injection site dressed with a band-aid.  Post-injection care was discussed and return precautions discussed.   Return in about 3 weeks (around 06/25/2019).  Trula Slade DPM

## 2019-06-25 ENCOUNTER — Other Ambulatory Visit: Payer: Self-pay

## 2019-06-25 ENCOUNTER — Encounter: Payer: Self-pay | Admitting: Internal Medicine

## 2019-06-25 ENCOUNTER — Ambulatory Visit (INDEPENDENT_AMBULATORY_CARE_PROVIDER_SITE_OTHER): Payer: No Typology Code available for payment source | Admitting: Internal Medicine

## 2019-06-25 VITALS — BP 128/78 | HR 68 | Temp 98.1°F | Ht 70.0 in | Wt 140.2 lb

## 2019-06-25 DIAGNOSIS — E0590 Thyrotoxicosis, unspecified without thyrotoxic crisis or storm: Secondary | ICD-10-CM

## 2019-06-25 DIAGNOSIS — E0500 Thyrotoxicosis with diffuse goiter without thyrotoxic crisis or storm: Secondary | ICD-10-CM

## 2019-06-25 NOTE — Patient Instructions (Signed)
-   Please contact us with the following symptoms : unexplained weight loss, diarrhea,arms shaking, or palpitations.

## 2019-06-25 NOTE — Progress Notes (Signed)
Name: Barbara Ingram  MRN/ DOB: [id number], 1965/10/17    Age/ Sex: 54 y.o., female     PCP: Leeroy Cha, MD   Reason for Endocrinology Evaluation:  Graves' Disease     Initial Endocrinology Clinic Visit: 03/21/2019    PATIENT IDENTIFIER: Barbara Ingram is a 54 y.o., female with a past medical history of IBS and hx of breast ca ( S/P mastectomy , no radiation or therapy). She has followed with [facility] since 03/21/2019 for consultative assistance with management of her hyperthyroidism   HISTORICAL SUMMARY:   Pt was noted with low TSh and elevated T4 during routine lab work in 02/2019.  In review of her records pt was noted to have a suppressed TSH in 05/2010 with normal FT4 at the time. Pt did have a thyroid uptake and scan in 2018 which showed very low 24 hour I 131 uptake = 1.4% (normal 10-30%)  Sees  alternative medical doctor(Lari Young)   Mother with thyroid disease SUBJECTIVE:   During last visit (03/21/2019): TFTs showed normalization of FT4 and T3, with suppressed TSH.  No treatment was offered at the time due to subclinical status.  Today (06/27/2019):  Barbara Ingram is here for a follow up on graves' disease.   She continues to have no symptoms today, denies any weight loss palpitation or diarrhea. She denies any tremors or local neck symptoms. She continues to be off iron tablets.  ROS:  As per HPI.   HISTORY:  Past Medical History:  Past Medical History:  Diagnosis Date  . ADENOCARCINOMA, BREAST dx 04/2003   lumpectomy, B mastectomy, no chemo  . ALLERGIC RHINITIS   . Breast cancer ([place]) 2005   right breast--in [place]  . IBS (irritable bowel syndrome)   . LIPOMAS, MULTIPLE   . THYROID STIMULATING HORMONE, ABNORMAL 2012   hyperthyroidism- no medication needed   Past Surgical History:  Past Surgical History:  Procedure Laterality Date  . BREAST BIOPSY  04/2003  . BREAST LUMPECTOMY  05/2003  . BREAST RECONSTRUCTION    . CESAREAN  SECTION  2003  . MASTECTOMY  05/2003   Bilateral--Hx Rt.breast cancer--in [place]  . ORIF WRIST FRACTURE  01/07/2012   Procedure: OPEN REDUCTION INTERNAL FIXATION (ORIF) WRIST FRACTURE;  Surgeon: Roseanne Kaufman, MD;  Location: [facility];  Service: Orthopedics;  Laterality: Left;  excision lipoma right forearm    Social History:  reports that she has never smoked. She has never used smokeless tobacco. She reports current alcohol use of about 5.0 standard drinks of alcohol per week. She reports that she does not use drugs. Family History:  Family History  Problem Relation Age of Onset  . Chronic bronchitis Mother   . Depression Mother   . Hypertension Father   . Hyperlipidemia Father   . Kidney disease Father   . Heart disease Father   . Hypertension Brother   . Hyperlipidemia Brother   . Colon cancer Neg Hx   . Esophageal cancer Neg Hx   . Stomach cancer Neg Hx   . Rectal cancer Neg Hx      HOME MEDICATIONS: Allergies as of 06/25/2019      Reactions   Keflex [cephalexin] Itching   hives   Penicillins Other (See Comments)   hives      Medication List       Accurate as of June 25, 2019 11:59 PM. If you have any questions, ask your nurse or doctor.  ascorbic acid 1000 MG tablet Commonly known as: VITAMIN C Take by mouth.   CO Q 10 PO Take 1 tablet by mouth daily.   meloxicam 7.5 MG tablet Commonly known as: MOBIC Take 7.5 mg by mouth daily as needed.   multivitamin with minerals Tabs tablet Take 1 tablet by mouth daily.   Naltrexone Powd 4.5 mg by Does not apply route at bedtime.   OVER THE COUNTER MEDICATION DIM Takes 250mg  daily   PRESCRIPTION MEDICATION Testosterone pelet every 4 months   progesterone 100 MG capsule Commonly known as: PROMETRIUM Take 100 mg by mouth daily.   Restasis 0.05 % ophthalmic emulsion Generic drug: cycloSPORINE Place 1 drop into both eyes daily.   scopolamine 1 MG/3DAYS Commonly known as: TRANSDERM-SCOP PLACE 1 PATCH  BEHIND EAR 4 HOURS BEFORE TRAVEL AND CAN REPLACE Q 3 DAYS UTD   VITAMIN D PO Take 1 tablet by mouth daily.         OBJECTIVE:   PHYSICAL EXAM: VS: BP 128/78 (BP Location: Left Arm, Patient Position: Sitting, Cuff Size: Normal)   Pulse 68   Temp 98.1 F (36.7 C)   Ht 5\' 10"  (1.778 m)   Wt 140 lb 3.2 oz (63.6 kg)   LMP 03/13/2017   SpO2 98%   BMI 20.12 kg/m    EXAM: General: Pt appears well and is in NAD  Neck: General: Supple without adenopathy. Thyroid: Thyroid size normal.  No goiter or nodules appreciated. No thyroid bruit.  Lungs: Clear with good BS bilat with no rales, rhonchi, or wheezes  Heart: Auscultation: RRR.  Abdomen: Normoactive bowel sounds, soft, nontender, without masses or organomegaly palpable  Extremities:  BL LE: No pretibial edema normal ROM and strength.  Mental Status: Judgment, insight: Intact Orientation: Oriented to time, place, and person Mood and affect: No depression, anxiety, or agitation     DATA REVIEWED:  Results for CHAELI, VILLAROSA (MRN [id number]) as of 06/27/2019 07:45  Ref. Range 03/12/2019 16:05 03/21/2019 15:00 06/25/2019 16:00  TSH Latest Ref Range: 0.35 - 4.50 uIU/mL <0.005 (L) <0.01 (L) <0.01 (L)  Triiodothyronine,Free,Serum Latest Ref Range: 2.0 - 4.4 pg/mL 5.1 (H)    Triiodothyronine (T3) Latest Ref Range: 76 - 181 ng/dL  148 190 (H)  T4,Free(Direct) Latest Ref Range: 0.60 - 1.60 ng/dL 1.90 (H) 1.34 1.82 (H)  Thyroglobulin Latest Units: ng/mL  48.0 (H)    TRAB <=2.00 IU/L 9.64High       ASSESSMENT / PLAN / RECOMMENDATIONS:   1. Hyperthyroidism secondary to Graves' disease  -Patient continues with clinical euthyroidism -Denies new local neck symptoms -Repeat TFTs show overt hyper thyroidism again. - We discussed that Graves' Disease is a result of an autoimmune condition involving the thyroid.   - We discussed with pt the benefits of methimazole in the Tx of hyperthyroidism, as well as the possible side  effects/complications of anti-thyroid drug Tx (specifically detailing the rare, but serious side effect of agranulocytosis). She was informed of need for regular thyroid function monitoring while on methimazole to ensure appropriate dosage without over-treatment. As well, we discussed the possible side effects of methimazole including the chance of rash, the small chance of liver irritation/juandice and the <=1 in 300-400 chance of sudden onset agranulocytosis.  We discussed importance of going to ED promptly (and stopping methimazole) if shewere to develop significant fever with severe sore throat of other evidence of acute infection.     We discussed the various treatment options for hyperthyroidism and Graves disease including ablation  therapy with radioactive iodine versus antithyroid drug treatment.  We recommended to the patient that we felt, at this time, that methimazole therapy would be most optimal.  We discussed the various possible benefits versus side effects of the various therapies.   I carefully explained to the patient that one of the consequences of I-131 ablation treatment would likely be permanent hypothyroidism which would require long-term replacement therapy with LT4.    Medications   2.  Graves' disease:  -No extrathyroidal manifestations of Graves' disease  Addendum: Lab results and the above treatment options as well as side effects of methimazole were discussed with the patient on 06/26/2019.  The patient would like some time to digest that information she was also requested the name of the medication to look it up.  I explained to the patient that despite the above mentioned side effects the benefit outweighs the risk.       Signed electronically by: Mack Guise, MD  [facility]  [facility] [address] [place], [place] [address] Phone: [phone] FAX: [phone]      CC: Leeroy Cha, MD [address] [place] [address] Phone: [phone]  Fax: [phone]   Return to Endocrinology clinic as below: Future Appointments  Date Time Provider [place]  07/16/2019 12:00 PM [place] Sherrilyn Rist, [place] [facility] None

## 2019-06-26 ENCOUNTER — Telehealth: Payer: Self-pay | Admitting: Internal Medicine

## 2019-06-26 LAB — T4, FREE: Free T4: 1.82 ng/dL — ABNORMAL HIGH (ref 0.60–1.60)

## 2019-06-26 LAB — TSH: TSH: <0.01 u[IU]/mL — ABNORMAL LOW (ref 0.35–4.50)

## 2019-06-26 LAB — T3: T3, Total: 190 ng/dL — ABNORMAL HIGH (ref 76–181)

## 2019-06-26 NOTE — Telephone Encounter (Signed)
Discussed lab results as below 2/10 at 1310    Results for Barbara Ingram, Barbara Ingram (MRN [id number]) as of 06/26/2019 13:10  Ref. Range 06/25/2019 16:00  TSH Latest Ref Range: 0.35 - 4.50 uIU/mL <0.01 (L)  Triiodothyronine (T3) Latest Ref Range: 76 - 181 ng/dL 190 (H)  T4,Free(Direct) Latest Ref Range: 0.60 - 1.60 ng/dL 1.82 (H)    The pt is overtly hyperthyroid, treatment with methimazole was recommended   We discussed that Graves' Disease is a result of an autoimmune condition involving the thyroid.    We discussed with pt the benefits of methimazole in the Tx of hyperthyroidism, as well as the possible side effects/complications of anti-thyroid drug Tx (specifically detailing the rare, but serious side effect of agranulocytosis). She was informed of need for regular thyroid function monitoring while on methimazole to ensure appropriate dosage without over-treatment. As well, we discussed the possible side effects of methimazole including the chance of rash, the small chance of liver irritation/juandice and the <=1 in 300-400 chance of sudden onset agranulocytosis.  We discussed importance of going to ED promptly (and stopping methimazole) if shewere to develop significant fever with severe sore throat of other evidence of acute infection.     We extensively discussed the various treatment options for hyperthyroidism and Graves disease including ablation therapy with radioactive iodine versus antithyroid drug treatment .  We recommended to the patient that we felt, at this time, that methimazole therapy would be most optimal.  We discussed the various possible benefits versus side effects of the various therapies.   I carefully explained to the patient that one of the consequences of I-131 ablation treatment would likely be permanent hypothyroidism which would require long-term replacement therapy with LT4.   During her visit yesterday we discussed the risk of osteoporosis and increased risk of cardiac  arrhythmia    Pt would like time to digest this information. She will contact me later with her decision. She also sees Dr. Annamaria Boots from alternative medicine    [place], MD  [facility]  [facility] [address] [place], [place] [address] Phone: [phone] FAX: [phone]

## 2019-06-27 ENCOUNTER — Ambulatory Visit: Payer: No Typology Code available for payment source | Admitting: Podiatry

## 2019-06-27 ENCOUNTER — Encounter: Payer: Self-pay | Admitting: Internal Medicine

## 2019-07-16 ENCOUNTER — Ambulatory Visit (INDEPENDENT_AMBULATORY_CARE_PROVIDER_SITE_OTHER): Payer: No Typology Code available for payment source | Admitting: Psychology

## 2019-07-16 DIAGNOSIS — F4323 Adjustment disorder with mixed anxiety and depressed mood: Secondary | ICD-10-CM

## 2019-07-25 ENCOUNTER — Encounter: Payer: Self-pay | Admitting: Internal Medicine

## 2019-07-26 MED ORDER — METHIMAZOLE 10 MG PO TABS: 10.0000 mg | ORAL_TABLET | Freq: Every day | ORAL | 6 refills | Status: AC

## 2019-08-28 ENCOUNTER — Ambulatory Visit (INDEPENDENT_AMBULATORY_CARE_PROVIDER_SITE_OTHER): Payer: No Typology Code available for payment source | Admitting: Psychology

## 2019-08-28 DIAGNOSIS — F4323 Adjustment disorder with mixed anxiety and depressed mood: Secondary | ICD-10-CM

## 2019-09-11 ENCOUNTER — Ambulatory Visit (INDEPENDENT_AMBULATORY_CARE_PROVIDER_SITE_OTHER): Payer: No Typology Code available for payment source | Admitting: Psychology

## 2019-09-11 DIAGNOSIS — F4323 Adjustment disorder with mixed anxiety and depressed mood: Secondary | ICD-10-CM | POA: Diagnosis not present

## 2019-09-13 ENCOUNTER — Ambulatory Visit: Payer: No Typology Code available for payment source | Admitting: Psychology

## 2019-09-16 ENCOUNTER — Ambulatory Visit (INDEPENDENT_AMBULATORY_CARE_PROVIDER_SITE_OTHER): Payer: No Typology Code available for payment source | Admitting: Psychology

## 2019-09-16 DIAGNOSIS — F4323 Adjustment disorder with mixed anxiety and depressed mood: Secondary | ICD-10-CM

## 2019-09-24 ENCOUNTER — Ambulatory Visit (INDEPENDENT_AMBULATORY_CARE_PROVIDER_SITE_OTHER): Payer: No Typology Code available for payment source | Admitting: Psychology

## 2019-09-24 DIAGNOSIS — F4323 Adjustment disorder with mixed anxiety and depressed mood: Secondary | ICD-10-CM | POA: Diagnosis not present

## 2019-10-02 ENCOUNTER — Ambulatory Visit: Payer: No Typology Code available for payment source | Admitting: Psychology

## 2019-11-11 ENCOUNTER — Ambulatory Visit (INDEPENDENT_AMBULATORY_CARE_PROVIDER_SITE_OTHER): Payer: No Typology Code available for payment source | Admitting: Psychology

## 2019-11-11 DIAGNOSIS — F4323 Adjustment disorder with mixed anxiety and depressed mood: Secondary | ICD-10-CM

## 2019-11-22 ENCOUNTER — Ambulatory Visit (INDEPENDENT_AMBULATORY_CARE_PROVIDER_SITE_OTHER): Payer: No Typology Code available for payment source | Admitting: Psychology

## 2019-11-22 DIAGNOSIS — F4323 Adjustment disorder with mixed anxiety and depressed mood: Secondary | ICD-10-CM

## 2020-03-18 ENCOUNTER — Ambulatory Visit (INDEPENDENT_AMBULATORY_CARE_PROVIDER_SITE_OTHER): Payer: No Typology Code available for payment source | Admitting: Psychology

## 2020-03-18 DIAGNOSIS — F4323 Adjustment disorder with mixed anxiety and depressed mood: Secondary | ICD-10-CM

## 2020-04-06 ENCOUNTER — Ambulatory Visit: Payer: No Typology Code available for payment source | Admitting: Psychology

## 2020-05-26 ENCOUNTER — Ambulatory Visit (INDEPENDENT_AMBULATORY_CARE_PROVIDER_SITE_OTHER): Payer: BC Managed Care – PPO | Admitting: Psychology

## 2020-05-26 DIAGNOSIS — F4323 Adjustment disorder with mixed anxiety and depressed mood: Secondary | ICD-10-CM

## 2020-06-02 ENCOUNTER — Ambulatory Visit (INDEPENDENT_AMBULATORY_CARE_PROVIDER_SITE_OTHER): Payer: BC Managed Care – PPO | Admitting: Psychology

## 2020-06-02 DIAGNOSIS — F4323 Adjustment disorder with mixed anxiety and depressed mood: Secondary | ICD-10-CM | POA: Diagnosis not present

## 2020-08-19 ENCOUNTER — Ambulatory Visit (INDEPENDENT_AMBULATORY_CARE_PROVIDER_SITE_OTHER): Payer: BC Managed Care – PPO | Admitting: Psychology

## 2020-08-19 DIAGNOSIS — F4323 Adjustment disorder with mixed anxiety and depressed mood: Secondary | ICD-10-CM

## 2024-02-15 ENCOUNTER — Other Ambulatory Visit: Payer: Self-pay | Admitting: Medical Genetics
